# Patient Record
Sex: Male | Born: 1955
Health system: Southern US, Community
[De-identification: ages and names within clinical notes are randomized; demographics above are authoritative.]

## PROBLEM LIST (undated history)

## (undated) DIAGNOSIS — H9190 Unspecified hearing loss, unspecified ear: Secondary | ICD-10-CM

## (undated) DIAGNOSIS — E7800 Pure hypercholesterolemia, unspecified: Secondary | ICD-10-CM

## (undated) DIAGNOSIS — Z8279 Family history of other congenital malformations, deformations and chromosomal abnormalities: Secondary | ICD-10-CM

## (undated) DIAGNOSIS — J301 Allergic rhinitis due to pollen: Secondary | ICD-10-CM

## (undated) DIAGNOSIS — E559 Vitamin D deficiency, unspecified: Secondary | ICD-10-CM

## (undated) DIAGNOSIS — K4090 Unilateral inguinal hernia, without obstruction or gangrene, not specified as recurrent: Secondary | ICD-10-CM

## (undated) DIAGNOSIS — Z87442 Personal history of urinary calculi: Secondary | ICD-10-CM

## (undated) DIAGNOSIS — R319 Hematuria, unspecified: Secondary | ICD-10-CM

## (undated) DIAGNOSIS — E785 Hyperlipidemia, unspecified: Secondary | ICD-10-CM

## (undated) DIAGNOSIS — J329 Chronic sinusitis, unspecified: Secondary | ICD-10-CM

## (undated) HISTORY — PX: OTHER SURGICAL HISTORY: SHX169

## (undated) HISTORY — DX: Chronic sinusitis, unspecified: J32.9

## (undated) HISTORY — DX: Unspecified hearing loss, unspecified ear: H91.90

## (undated) HISTORY — DX: Family history of other congenital malformations, deformations and chromosomal abnormalities: Z82.79

## (undated) HISTORY — DX: Allergic rhinitis due to pollen: J30.1

## (undated) HISTORY — PX: WISDOM TOOTH EXTRACTION: SHX21

## (undated) HISTORY — DX: Vitamin D deficiency, unspecified: E55.9

## (undated) HISTORY — DX: Hyperlipidemia, unspecified: E78.5

## (undated) HISTORY — DX: Pure hypercholesterolemia, unspecified: E78.00

## (undated) HISTORY — DX: Hematuria, unspecified: R31.9

---

## 2003-07-16 ENCOUNTER — Encounter: Admission: RE | Admit: 2003-07-16 | Discharge: 2003-07-16 | Payer: Self-pay | Admitting: Family Medicine

## 2004-11-09 ENCOUNTER — Encounter: Admission: RE | Admit: 2004-11-09 | Discharge: 2004-11-09 | Payer: Self-pay | Admitting: Family Medicine

## 2004-12-01 ENCOUNTER — Encounter: Admission: RE | Admit: 2004-12-01 | Discharge: 2004-12-01 | Payer: Self-pay | Admitting: Family Medicine

## 2005-09-14 ENCOUNTER — Ambulatory Visit: Payer: Self-pay | Admitting: Family Medicine

## 2005-09-20 ENCOUNTER — Ambulatory Visit: Payer: Self-pay | Admitting: Family Medicine

## 2006-05-25 ENCOUNTER — Ambulatory Visit: Payer: Self-pay | Admitting: Family Medicine

## 2008-01-19 ENCOUNTER — Ambulatory Visit: Payer: Self-pay | Admitting: Family Medicine

## 2008-01-19 LAB — CONVERTED CEMR LAB
ALT: 15 units/L (ref 0–53)
AST: 21 units/L (ref 0–37)
Albumin: 4.2 g/dL (ref 3.5–5.2)
Alkaline Phosphatase: 40 units/L (ref 39–117)
BUN: 16 mg/dL (ref 6–23)
Basophils Absolute: 0 10*3/uL (ref 0.0–0.1)
Basophils Relative: 0.6 % (ref 0.0–3.0)
Bilirubin Urine: NEGATIVE
Bilirubin, Direct: 0.1 mg/dL (ref 0.0–0.3)
Blood in Urine, dipstick: NEGATIVE
CO2: 32 meq/L (ref 19–32)
Calcium: 9.5 mg/dL (ref 8.4–10.5)
Chloride: 103 meq/L (ref 96–112)
Cholesterol: 198 mg/dL (ref 0–200)
Creatinine, Ser: 1.1 mg/dL (ref 0.4–1.5)
Eosinophils Absolute: 0.1 10*3/uL (ref 0.0–0.7)
Eosinophils Relative: 2.4 % (ref 0.0–5.0)
GFR calc Af Amer: 91 mL/min
GFR calc non Af Amer: 75 mL/min
Glucose, Bld: 74 mg/dL (ref 70–99)
Glucose, Urine, Semiquant: NEGATIVE
HCT: 41.4 % (ref 39.0–52.0)
HDL: 60.5 mg/dL (ref >39.0)
Hemoglobin: 14.2 g/dL (ref 13.0–17.0)
Ketones, urine, test strip: NEGATIVE
LDL Cholesterol: 133 mg/dL — ABNORMAL HIGH (ref 0–99)
Lymphocytes Relative: 39.1 % (ref 12.0–46.0)
MCHC: 34.3 g/dL (ref 30.0–36.0)
MCV: 89.9 fL (ref 78.0–100.0)
Monocytes Absolute: 0.4 10*3/uL (ref 0.1–1.0)
Monocytes Relative: 8.3 % (ref 3.0–12.0)
Neutro Abs: 2.7 10*3/uL (ref 1.4–7.7)
Neutrophils Relative %: 49.6 % (ref 43.0–77.0)
Nitrite: NEGATIVE
PSA: 0.35 ng/mL (ref 0.10–4.00)
Platelets: 233 10*3/uL (ref 150–400)
Potassium: 4.2 meq/L (ref 3.5–5.1)
Protein, U semiquant: NEGATIVE
RBC: 4.61 M/uL (ref 4.22–5.81)
RDW: 12.8 % (ref 11.5–14.6)
Sodium: 141 meq/L (ref 135–145)
Specific Gravity, Urine: 1.025
TSH: 0.86 microintl units/mL (ref 0.35–5.50)
Total Bilirubin: 1.1 mg/dL (ref 0.3–1.2)
Total CHOL/HDL Ratio: 3.3
Total Protein: 6.9 g/dL (ref 6.0–8.3)
Triglycerides: 22 mg/dL (ref 0–149)
Urobilinogen, UA: 0.2
VLDL: 4 mg/dL (ref 0–40)
WBC Urine, dipstick: NEGATIVE
WBC: 5.3 10*3/uL (ref 4.5–10.5)
pH: 5.5

## 2008-01-26 ENCOUNTER — Ambulatory Visit: Payer: Self-pay | Admitting: Family Medicine

## 2008-04-03 ENCOUNTER — Ambulatory Visit: Payer: Self-pay | Admitting: Gastroenterology

## 2008-04-29 ENCOUNTER — Ambulatory Visit: Payer: Self-pay | Admitting: Gastroenterology

## 2008-04-29 HISTORY — PX: COLONOSCOPY: SHX174

## 2008-04-29 LAB — HM COLONOSCOPY

## 2009-01-28 ENCOUNTER — Ambulatory Visit (HOSPITAL_BASED_OUTPATIENT_CLINIC_OR_DEPARTMENT_OTHER): Admission: RE | Admit: 2009-01-28 | Discharge: 2009-01-28 | Payer: Self-pay | Admitting: Urology

## 2010-03-12 ENCOUNTER — Ambulatory Visit: Payer: Self-pay | Admitting: Family Medicine

## 2010-03-12 LAB — CONVERTED CEMR LAB
ALT: 21 units/L (ref 0–53)
AST: 23 units/L (ref 0–37)
Albumin: 4.3 g/dL (ref 3.5–5.2)
Alkaline Phosphatase: 44 units/L (ref 39–117)
BUN: 16 mg/dL (ref 6–23)
Basophils Absolute: 0 10*3/uL (ref 0.0–0.1)
Basophils Relative: 0.6 % (ref 0.0–3.0)
Bilirubin Urine: NEGATIVE
Bilirubin, Direct: 0.1 mg/dL (ref 0.0–0.3)
Blood in Urine, dipstick: NEGATIVE
CO2: 31 meq/L (ref 19–32)
Calcium: 9.3 mg/dL (ref 8.4–10.5)
Chloride: 100 meq/L (ref 96–112)
Cholesterol: 227 mg/dL — ABNORMAL HIGH (ref 0–200)
Creatinine, Ser: 1 mg/dL (ref 0.4–1.5)
Direct LDL: 138.8 mg/dL
Eosinophils Absolute: 0.1 10*3/uL (ref 0.0–0.7)
Eosinophils Relative: 2.3 % (ref 0.0–5.0)
GFR calc non Af Amer: 80 mL/min (ref >60)
Glucose, Bld: 89 mg/dL (ref 70–99)
Glucose, Urine, Semiquant: NEGATIVE
HCT: 39.3 % (ref 39.0–52.0)
HDL: 66.1 mg/dL (ref >39.00)
Hemoglobin: 13.3 g/dL (ref 13.0–17.0)
Ketones, urine, test strip: NEGATIVE
Lymphocytes Relative: 35.2 % (ref 12.0–46.0)
Lymphs Abs: 1.8 10*3/uL (ref 0.7–4.0)
MCHC: 33.9 g/dL (ref 30.0–36.0)
MCV: 90.4 fL (ref 78.0–100.0)
Monocytes Absolute: 0.5 10*3/uL (ref 0.1–1.0)
Monocytes Relative: 10.4 % (ref 3.0–12.0)
Neutro Abs: 2.7 10*3/uL (ref 1.4–7.7)
Neutrophils Relative %: 51.5 % (ref 43.0–77.0)
Nitrite: NEGATIVE
PSA: 0.41 ng/mL (ref 0.10–4.00)
Platelets: 221 10*3/uL (ref 150.0–400.0)
Potassium: 4 meq/L (ref 3.5–5.1)
Protein, U semiquant: NEGATIVE
RBC: 4.35 M/uL (ref 4.22–5.81)
RDW: 14 % (ref 11.5–14.6)
Sodium: 138 meq/L (ref 135–145)
Specific Gravity, Urine: 1.02
TSH: 0.86 microintl units/mL (ref 0.35–5.50)
Total Bilirubin: 0.7 mg/dL (ref 0.3–1.2)
Total CHOL/HDL Ratio: 3
Total Protein: 6.8 g/dL (ref 6.0–8.3)
Triglycerides: 29 mg/dL (ref 0.0–149.0)
Urobilinogen, UA: 0.2
VLDL: 5.8 mg/dL (ref 0.0–40.0)
WBC Urine, dipstick: NEGATIVE
WBC: 5.2 10*3/uL (ref 4.5–10.5)
pH: 7

## 2010-03-19 ENCOUNTER — Ambulatory Visit: Payer: Self-pay | Admitting: Family Medicine

## 2010-03-19 ENCOUNTER — Encounter: Payer: Self-pay | Admitting: Family Medicine

## 2010-05-12 NOTE — Assessment & Plan Note (Signed)
Summary: cpx//ccm   Vital Signs:  Patient profile:   55 year old male Height:      72.75 inches Weight:      190 pounds BMI:     25.33 Temp:     98.4 degrees F oral BP sitting:   98 / 68  (left arm) Cuff size:   regular  Vitals Entered By: Kern Reap CMA Duncan Dull) (March 19, 2010 2:47 PM) CC: cpx Is Patient Diabetic? No   CC:  cpx.  History of Present Illness: Scott Schultz is a 55 year old,  married male, nonsmoker physically fit, who comes in today for general physical examination  He's always been in excellent health.  He said no chronic health problems.  He gets routine eye care, dental care, colonoscopy last year.  Normal, tetanus, 2004, seasonal flu shot 2011.  Review of systems negative except for some shoulder pain.  He had two to 3 weeks ago.  It went away spontaneo  Not related to exertion.  No shortness of breath, etc.  Allergies: No Known Drug Allergies  Past History:  Past medical, surgical, family and social histories (including risk factors) reviewed, and no changes noted (except as noted below).  Past Medical History: Reviewed history from 01/10/2007 and no changes required. Hearing Loss Sinusitis  Past Surgical History: Reviewed history from 01/10/2007 and no changes required. Denies surgical history  Family History: Reviewed history from 01/26/2008 and no changes required. father and mother both in good health in their 79s.  Four brothers in good health.  No sisters  Social History: Reviewed history from 01/26/2008 and no changes required. Occupation: Married Never Smoked Alcohol use-yes Regular exercise-yes  Review of Systems      See HPI  Physical Exam  General:  Well-developed,well-nourished,in no acute distress; alert,appropriate and cooperative throughout examination Head:  Normocephalic and atraumatic without obvious abnormalities. No apparent alopecia or balding. Eyes:  No corneal or conjunctival inflammation noted. EOMI. Perrla.  Funduscopic exam benign, without hemorrhages, exudates or papilledema. Vision grossly normal. Ears:  External ear exam shows no significant lesions or deformities.  Otoscopic examination reveals clear canals, tympanic membranes are intact bilaterally without bulging, retraction, inflammation or discharge. Hearing is grossly normal bilaterally. Nose:  External nasal examination shows no deformity or inflammation. Nasal mucosa are pink and moist without lesions or exudates. Mouth:  Oral mucosa and oropharynx without lesions or exudates.  Teeth in good repair. Neck:  No deformities, masses, or tenderness noted. Chest Wall:  No deformities, masses, tenderness or gynecomastia noted. Breasts:  No masses or gynecomastia noted Lungs:  Normal respiratory effort, chest expands symmetrically. Lungs are clear to auscultation, no crackles or wheezes. Heart:  Normal rate and regular rhythm. S1 and S2 normal without gallop, murmur, click, rub or other extra sounds. Abdomen:  Bowel sounds positive,abdomen soft and non-tender without masses, organomegaly or hernias noted. Rectal:  No external abnormalities noted. Normal sphincter tone. No rectal masses or tenderness. Genitalia:  Testes bilaterally descended without nodularity, tenderness or masses. No scrotal masses or lesions. No penis lesions or urethral discharge. Prostate:  Prostate gland firm and smooth, no enlargement, nodularity, tenderness, mass, asymmetry or induration. Msk:  No deformity or scoliosis noted of thoracic or lumbar spine.   Pulses:  R and L carotid,radial,femoral,dorsalis pedis and posterior tibial pulses are full and equal bilaterally Extremities:  No clubbing, cyanosis, edema, or deformity noted with normal full range of motion of all joints.   Neurologic:  No cranial nerve deficits noted. Station and gait are normal.  Plantar reflexes are down-going bilaterally. DTRs are symmetrical throughout. Sensory, motor and coordinative functions appear  intact. Skin:  Intact without suspicious lesions or rashes Cervical Nodes:  No lymphadenopathy noted Axillary Nodes:  No palpable lymphadenopathy Inguinal Nodes:  No significant adenopathy Psych:  Cognition and judgment appear intact. Alert and cooperative with normal attention span and concentration. No apparent delusions, illusions, hallucinations   Impression & Recommendations:  Problem # 1:  Preventive Health Care (ICD-V70.0) Assessment Unchanged  Other Orders: EKG w/ Interpretation (93000)  Patient Instructions: 1)  Please schedule a follow-up appointment in 1 year. 2)  It is important that you exercise regularly at least 20 minutes 5 times a week. If you develop chest pain, have severe difficulty breathing, or feel very tired , stop exercising immediately and seek medical attention. 3)  Take an Aspirin every day.   Orders Added: 1)  Est. Patient 40-64 years [99396] 2)  EKG w/ Interpretation [93000]   Immunization History:  Influenza Immunization History:    Influenza:  historical (01/10/2010)  Tetanus/Td Immunization History:    Tetanus/Td:  historical (04/12/2002)   Immunization History:  Influenza Immunization History:    Influenza:  Historical (01/10/2010)  Tetanus/Td Immunization History:    Tetanus/Td:  Historical (04/12/2002)

## 2010-07-16 LAB — POCT HEMOGLOBIN-HEMACUE: Hemoglobin: 14.3 g/dL (ref 13.0–17.0)

## 2010-10-13 ENCOUNTER — Encounter: Payer: Self-pay | Admitting: Family Medicine

## 2010-10-15 ENCOUNTER — Encounter: Payer: Self-pay | Admitting: Family Medicine

## 2010-10-15 ENCOUNTER — Ambulatory Visit (INDEPENDENT_AMBULATORY_CARE_PROVIDER_SITE_OTHER)
Admission: RE | Admit: 2010-10-15 | Discharge: 2010-10-15 | Disposition: A | Discharge status: Home / Self Care | Payer: 59 | Source: Ambulatory Visit | Attending: Family Medicine | Admitting: Family Medicine

## 2010-10-15 ENCOUNTER — Ambulatory Visit (INDEPENDENT_AMBULATORY_CARE_PROVIDER_SITE_OTHER): Payer: 59 | Admitting: Family Medicine

## 2010-10-15 VITALS — BP 110/80 | Temp 97.7°F | Ht 75.0 in | Wt 185.0 lb

## 2010-10-15 DIAGNOSIS — R51 Headache: Secondary | ICD-10-CM

## 2010-10-15 MED ORDER — PREDNISONE 20 MG PO TABS: ORAL_TABLET | ORAL | Status: DC

## 2010-10-15 MED ORDER — AMOXICILLIN 500 MG PO CAPS: ORAL_CAPSULE | ORAL | Status: DC

## 2010-10-15 NOTE — Progress Notes (Signed)
  Subjective:    Patient ID: Scott Schultz, male    DOB: 03-15-1956, 55 y.o.   MRN: [id number]  HPI Scott Schultz is a 55 year old, married male, nonsmoker, who comes in today with a 76-month history of right frontal and maxillary sinus congestion.  He states his symptoms, come and go.  He has no fever.  No history of trauma.  He does have a history of underlying allergic rhinitis.  Left side clear.   Review of Systems General an immunologic review of systems otherwise negative    Objective:   Physical Exam    Well-developed well-nourished, male in no acute distress.  HEENT negative.  Neck was supple.  No adenopathy.  Examination listed as a septum in the midline.  He has almost a total occlusion on the right.  Turbinates, left is fairly clear    Assessment & Plan:  Allergic rhinitis.  Plan x-ray sinuses to rule out sinus infection, prednisone burst and taper in pediatrics as needed.  ENT consult if not improved

## 2010-10-15 NOTE — Patient Instructions (Signed)
Go to the main office now for x-rays of your sinuses.  Scott Schultz will call you tomorrow with the report.  If there is infection.  We will start her on amoxicillin, 500 mg 3 times a day for 3 weeks if no infection, then we used prednisone as directed

## 2010-12-07 ENCOUNTER — Ambulatory Visit (INDEPENDENT_AMBULATORY_CARE_PROVIDER_SITE_OTHER): Payer: 59 | Admitting: Family Medicine

## 2010-12-07 ENCOUNTER — Encounter: Payer: Self-pay | Admitting: Family Medicine

## 2010-12-07 VITALS — BP 100/62 | Temp 98.1°F | Wt 183.0 lb

## 2010-12-07 DIAGNOSIS — L247 Irritant contact dermatitis due to plants, except food: Secondary | ICD-10-CM

## 2010-12-07 DIAGNOSIS — L255 Unspecified contact dermatitis due to plants, except food: Secondary | ICD-10-CM

## 2010-12-07 NOTE — Progress Notes (Signed)
  Subjective:    Patient ID: Scott Schultz, male    DOB: Dec 12, 1955, 55 y.o.   MRN: [id number]  HPIJohn is a 55 year old male, who comes in today with a rash for 3 days.  He first noticed a streaky red pruritic lesion on his left lower extremity and into it spread to his trunk.  Review of systems otherwise negative    Review of Systems    [facility] dermatologic review of systems otherwise negative Objective:   Physical Exam Well-developed well-nourished man in no acute distress.  Examination.  Skin shows red.  The elevated lesions consistent with a contact dermatitis       Assessment & Plan:  Contact dermatitis.  Plan prednisone burst and taper return p.r.n.

## 2010-12-07 NOTE — Patient Instructions (Signed)
3  prednisone tablets for 3 days, one for 3 days, a half for 3 days, then stop

## 2010-12-30 ENCOUNTER — Telehealth: Payer: Self-pay | Admitting: *Deleted

## 2010-12-30 NOTE — Telephone Encounter (Signed)
Pt is having bilateral leg numbness???  Appt made with Dr. Tawanna Cooler tomorrow.

## 2010-12-31 ENCOUNTER — Ambulatory Visit (INDEPENDENT_AMBULATORY_CARE_PROVIDER_SITE_OTHER): Payer: 59 | Admitting: Family Medicine

## 2010-12-31 ENCOUNTER — Encounter: Payer: Self-pay | Admitting: Family Medicine

## 2010-12-31 VITALS — BP 110/68 | Temp 98.6°F | Wt 183.0 lb

## 2010-12-31 DIAGNOSIS — R202 Paresthesia of skin: Secondary | ICD-10-CM

## 2010-12-31 DIAGNOSIS — R209 Unspecified disturbances of skin sensation: Secondary | ICD-10-CM

## 2010-12-31 DIAGNOSIS — R200 Anesthesia of skin: Secondary | ICD-10-CM | POA: Insufficient documentation

## 2010-12-31 NOTE — Patient Instructions (Signed)
Take 600 mg of Motrin twice daily with food.  If in the next couple weeks.  Her symptoms do not abate or if develops severe pain, then call

## 2010-12-31 NOTE — Progress Notes (Signed)
  Subjective:    Patient ID: Scott Schultz, male    DOB: Feb 12, 1956, 55 y.o.   MRN: [id number]  Scott Schultz is a delightful, 55 year old, married man nonsmoker,,,,,,,, his wife, Scott Schultz, works for his [employer],,,,,,,,,, who comes in today for evaluation of numbness and tingling in both lower extremities for two days.  He states, that he's had this tingling sensation that comes and goes.  It involves both legs, and the hips to the feet.  It will last for a few seconds maybe a minute or two and then go away.  No history of trauma No pain.  Years ago.  He had a lumbar disk problem.  He was seen by an orthopedist and had a workup.  That resolved without any surgical intervention.    Review of Systems General and neurologic review of systems otherwise negative.  No bowel.  No bladder dysfunction    Objective:   Physical Exam  Well-developed well-nourished man in acute distress.  Examination and was normal.  Lower extremities appear normal.  Muscle strength, sensation, reflexes, circulation, all normal.  Straight leg raising negative      Assessment & Plan:  Symptoms consistent with a central bulging disk.  Plan Motrin 600 b.i.d. Observe.  If symptoms do not resolve or he develops severe pain, then will pursue evaluation

## 2011-01-22 ENCOUNTER — Ambulatory Visit (INDEPENDENT_AMBULATORY_CARE_PROVIDER_SITE_OTHER): Payer: 59

## 2011-01-22 DIAGNOSIS — Z23 Encounter for immunization: Secondary | ICD-10-CM

## 2011-01-28 ENCOUNTER — Encounter: Payer: Self-pay | Admitting: Family Medicine

## 2011-01-28 ENCOUNTER — Telehealth: Payer: Self-pay | Admitting: Family Medicine

## 2011-01-28 ENCOUNTER — Ambulatory Visit (INDEPENDENT_AMBULATORY_CARE_PROVIDER_SITE_OTHER): Payer: 59 | Admitting: Family Medicine

## 2011-01-28 DIAGNOSIS — T7840XA Allergy, unspecified, initial encounter: Secondary | ICD-10-CM

## 2011-01-28 DIAGNOSIS — R51 Headache: Secondary | ICD-10-CM

## 2011-01-28 HISTORY — DX: Allergy, unspecified, initial encounter: T78.40XA

## 2011-01-28 MED ORDER — PREDNISONE 20 MG PO TABS: ORAL_TABLET | ORAL | Status: DC

## 2011-01-28 NOTE — Patient Instructions (Signed)
Take the prednisone as directed.  You can use small amounts of the OTC steroid cream and Lubriderm.  Claritin 10 mg in the morning and Benadryl 25 mg at bedtime to help with the itching.  Stop the Augmentin.  Return p.r.n.

## 2011-01-28 NOTE — Telephone Encounter (Signed)
Okay to work in patient is aware

## 2011-01-28 NOTE — Telephone Encounter (Signed)
Pt has a sinus infection and it is going up into his eyes, his are are very swollen and he would like to come in today. Work in?

## 2011-01-28 NOTE — Progress Notes (Signed)
  Subjective:    Patient ID: Scott Schultz, male    DOB: 1956/01/26, 55 y.o.   MRN: [id number]  Scott Schultz is a delightful, 55 year old, married male, nonsmoker, who comes in today for evaluation of a skin rash.  We saw him a couple weeks ago with a flare of his allergic rhinitis.  He had tried the over-the-counter medications to no avail.  His symptoms at that time or at congestion, postnasal drip, cough, with no wheezing.  We gave him a short course of prednisone, which she finished last Thursday.  He felt much better.  On Saturday he noticed some redness and swelling of his eyes.  By Tuesday evening.  They were really red, swollen, and itchy.  [facility] and was told he had a sinus infection and given Augmentin!!!!!!!!!!!!!!!!!!!!!!!!!!!!!!!!.  He stated a because the medications has not helped.  His symptoms.  He's been using over-the-counter.  Mild steroid cream for the allergic reaction of his eyelids.  However, that is not resolved.  The redness and swelling.    Review of Systems    General allergic ENT review of systems, and dermatologic review of systems otherwise negative Objective:   Physical Exam  Well about the marriage now.  No acute distress.  Examination of the HEENT were negative, except the upper and lower lids were read and swollen consistent with a contact dermatitis or      Assessment & Plan:  Bilateral upper and lower eyelid, contact dermatitis,,,,, unresponsive to over-the-counter steroid cream,,,,,,,, prednisone burst and taper.  Allergic rhinitis.......... Prednisone burst and taper......... Stop the Augmentin

## 2011-02-09 ENCOUNTER — Telehealth: Payer: Self-pay | Admitting: *Deleted

## 2011-02-09 NOTE — Telephone Encounter (Signed)
Rachel please call.......... We are reluctant to continue the higher dose of steroid because of the potential long-term side effects.  At this juncture I recommend he see [facility] dermatologist for consultation to see if there is other options.  He can call and make his own appointment

## 2011-02-09 NOTE — Telephone Encounter (Signed)
Spoke with patient.

## 2011-02-09 NOTE — Telephone Encounter (Signed)
Pt's dermatitis around eyes got better while on the higher dosage of Prednisone, but since tapering to 1 q o day, the symptoms are coming back.

## 2011-11-23 ENCOUNTER — Telehealth: Payer: Self-pay | Admitting: Family Medicine

## 2011-11-23 NOTE — Telephone Encounter (Signed)
Caller: [name]/Patient; Patient Name: Scott Schultz; PCP: Roderick Pee.; Best Callback Phone Number: [phone] Injury to Leg - 3 inch cut from Metal Handrail on 11/12/11- Went to [facility]. Given [employer] and wound stitched.  Stitches removed from leg on 11/21/11 and  Started on antibiotic-Keflex on 11/22/11. Today-11/23/11 has pus in center of wound, slight ozzing , and edges look red. He has been putting Neosporin on it several times daily. Triage and Care Advice  per Post Operative Wound Care Protocol and advised to have it checked within 24 hours. He is going to follow care advice and will call back on 11/24/11 if not looking better.

## 2011-11-24 ENCOUNTER — Telehealth: Payer: Self-pay | Admitting: Family Medicine

## 2011-11-24 NOTE — Telephone Encounter (Signed)
Pt has an infected 3" cut on rt leg. Pt said that he spoke to triage nurse yesterday, who recommended pt to come in for ov asap. Pt req call back from nurse. Pls see previouse triage note.

## 2011-11-24 NOTE — Telephone Encounter (Signed)
Patient coming to the office in the morning

## 2011-11-25 ENCOUNTER — Ambulatory Visit: Payer: 59 | Admitting: Family Medicine

## 2012-01-25 ENCOUNTER — Ambulatory Visit (INDEPENDENT_AMBULATORY_CARE_PROVIDER_SITE_OTHER): Payer: 59 | Admitting: Family Medicine

## 2012-01-25 DIAGNOSIS — Z23 Encounter for immunization: Secondary | ICD-10-CM

## 2012-03-24 ENCOUNTER — Other Ambulatory Visit (INDEPENDENT_AMBULATORY_CARE_PROVIDER_SITE_OTHER): Payer: 59

## 2012-03-24 DIAGNOSIS — Z0000 Encounter for general adult medical examination without abnormal findings: Secondary | ICD-10-CM

## 2012-03-24 LAB — TSH: TSH: 1.12 u[IU]/mL (ref 0.35–5.50)

## 2012-03-24 LAB — HEPATIC FUNCTION PANEL
ALT: 17 U/L (ref 0–53)
AST: 21 U/L (ref 0–37)
Albumin: 4.3 g/dL (ref 3.5–5.2)
Alkaline Phosphatase: 46 U/L (ref 39–117)
Bilirubin, Direct: 0.1 mg/dL (ref 0.0–0.3)
Total Bilirubin: 0.7 mg/dL (ref 0.3–1.2)
Total Protein: 6.8 g/dL (ref 6.0–8.3)

## 2012-03-24 LAB — CBC WITH DIFFERENTIAL/PLATELET
Basophils Absolute: 0 10*3/uL (ref 0.0–0.1)
Basophils Relative: 0.5 % (ref 0.0–3.0)
Eosinophils Absolute: 0.3 10*3/uL (ref 0.0–0.7)
Eosinophils Relative: 5.1 % — ABNORMAL HIGH (ref 0.0–5.0)
HCT: 42.9 % (ref 39.0–52.0)
Hemoglobin: 14.4 g/dL (ref 13.0–17.0)
Lymphocytes Relative: 33.8 % (ref 12.0–46.0)
Lymphs Abs: 1.8 10*3/uL (ref 0.7–4.0)
MCHC: 33.6 g/dL (ref 30.0–36.0)
MCV: 89.9 fl (ref 78.0–100.0)
Monocytes Absolute: 0.5 10*3/uL (ref 0.1–1.0)
Monocytes Relative: 9.5 % (ref 3.0–12.0)
Neutro Abs: 2.7 10*3/uL (ref 1.4–7.7)
Neutrophils Relative %: 51.1 % (ref 43.0–77.0)
Platelets: 215 10*3/uL (ref 150.0–400.0)
RBC: 4.77 Mil/uL (ref 4.22–5.81)
RDW: 14 % (ref 11.5–14.6)
WBC: 5.3 10*3/uL (ref 4.5–10.5)

## 2012-03-24 LAB — POCT URINALYSIS DIPSTICK
Bilirubin, UA: NEGATIVE
Blood, UA: NEGATIVE
Glucose, UA: NEGATIVE
Ketones, UA: NEGATIVE
Leukocytes, UA: NEGATIVE
Nitrite, UA: NEGATIVE
Protein, UA: NEGATIVE
Spec Grav, UA: 1.02
Urobilinogen, UA: 0.2
pH, UA: 6.5

## 2012-03-24 LAB — BASIC METABOLIC PANEL
BUN: 17 mg/dL (ref 6–23)
CO2: 33 mEq/L — ABNORMAL HIGH (ref 19–32)
Calcium: 9.5 mg/dL (ref 8.4–10.5)
Chloride: 101 mEq/L (ref 96–112)
Creatinine, Ser: 0.9 mg/dL (ref 0.4–1.5)
GFR: 91.6 mL/min (ref >60.00)
Glucose, Bld: 98 mg/dL (ref 70–99)
Potassium: 4.3 mEq/L (ref 3.5–5.1)
Sodium: 139 mEq/L (ref 135–145)

## 2012-03-24 LAB — LIPID PANEL
Cholesterol: 206 mg/dL — ABNORMAL HIGH (ref 0–200)
HDL: 71.3 mg/dL (ref >39.00)
Total CHOL/HDL Ratio: 3
Triglycerides: 35 mg/dL (ref 0.0–149.0)
VLDL: 7 mg/dL (ref 0.0–40.0)

## 2012-03-24 LAB — PSA: PSA: 0.34 ng/mL (ref 0.10–4.00)

## 2012-03-24 LAB — LDL CHOLESTEROL, DIRECT: Direct LDL: 129.8 mg/dL

## 2012-03-30 ENCOUNTER — Encounter: Payer: Self-pay | Admitting: Family Medicine

## 2012-03-30 ENCOUNTER — Ambulatory Visit (INDEPENDENT_AMBULATORY_CARE_PROVIDER_SITE_OTHER): Payer: 59 | Admitting: Family Medicine

## 2012-03-30 VITALS — BP 110/70 | Ht 73.75 in | Wt 189.0 lb

## 2012-03-30 DIAGNOSIS — J309 Allergic rhinitis, unspecified: Secondary | ICD-10-CM

## 2012-03-30 DIAGNOSIS — H9190 Unspecified hearing loss, unspecified ear: Secondary | ICD-10-CM

## 2012-03-30 DIAGNOSIS — Z0000 Encounter for general adult medical examination without abnormal findings: Secondary | ICD-10-CM

## 2012-03-30 MED ORDER — FLUTICASONE PROPIONATE 50 MCG/ACT NA SUSP: 2.0000 | Freq: Every day | NASAL | Status: DC

## 2012-03-30 NOTE — Patient Instructions (Signed)
Continue your good health habits,,,,,,,,,, especially swimming  Zyrtec 10 mg plain one tablet at bedtime  Steroid nasal spray when necessary  Return in one year sooner if any problems  I would also recommend you take an aspirin tablet daily

## 2012-03-30 NOTE — Progress Notes (Signed)
  Subjective:    Patient ID: Scott Schultz, male    DOB: October 22, 1955, 56 y.o.   MRN: [id number]  HPI Caio is a 56 year old married male nonsmoker who comes in today for general physical examination  He's always been in excellent health he has no chronic health problems except for chronic hearing loss and he wears bilateral hearing aids.  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  Review of systems negative except for some underlying allergic rhinitis   Review of Systems  Constitutional: Negative.   HENT: Negative.   Eyes: Negative.   Respiratory: Negative.   Cardiovascular: Negative.   Gastrointestinal: Negative.   Genitourinary: Negative.   Musculoskeletal: Negative.   Skin: Negative.   Neurological: Negative.   Hematological: Negative.   Psychiatric/Behavioral: Negative.        Objective:   Physical Exam  Constitutional: He is oriented to person, place, and time. He appears well-developed and well-nourished.  HENT:  Head: Normocephalic and atraumatic.  Right Ear: External ear normal.  Left Ear: External ear normal.  Nose: Nose normal.  Mouth/Throat: Oropharynx is clear and moist.  Eyes: Conjunctivae normal and EOM are normal. Pupils are equal, round, and reactive to light.  Neck: Normal range of motion. Neck supple. No JVD present. No tracheal deviation present. No thyromegaly present.       Bilateral hearing aids  Cardiovascular: Normal rate, regular rhythm, normal heart sounds and intact distal pulses.  Exam reveals no gallop and no friction rub.   No murmur heard. Pulmonary/Chest: Effort normal and breath sounds normal. No stridor. No respiratory distress. He has no wheezes. He has no rales. He exhibits no tenderness.  Abdominal: Soft. Bowel sounds are normal. He exhibits no distension and no mass. There is no  tenderness. There is no rebound and no guarding.  Genitourinary: Rectum normal, prostate normal and penis normal. Guaiac negative stool. No penile tenderness.  Musculoskeletal: Normal range of motion. He exhibits no edema and no tenderness.  Lymphadenopathy:    He has no cervical adenopathy.  Neurological: He is alert and oriented to person, place, and time. He has normal reflexes. No cranial nerve deficit. He exhibits normal muscle tone.  Skin: Skin is warm and dry. No rash noted. No erythema. No pallor.  Psychiatric: He has a normal mood and affect. His behavior is normal. Judgment and thought content normal.          Assessment & Plan:  Healthy male  Hearing loss bilateral hearing aids  Allergic rhinitis OT  Antihistamine each bedtime

## 2012-04-06 ENCOUNTER — Encounter: Payer: Self-pay | Admitting: Family Medicine

## 2012-04-06 ENCOUNTER — Ambulatory Visit (INDEPENDENT_AMBULATORY_CARE_PROVIDER_SITE_OTHER): Payer: 59 | Admitting: Family Medicine

## 2012-04-06 VITALS — BP 110/68 | Temp 98.1°F | Wt 190.0 lb

## 2012-04-06 DIAGNOSIS — L255 Unspecified contact dermatitis due to plants, except food: Secondary | ICD-10-CM

## 2012-04-06 MED ORDER — PREDNISONE 20 MG PO TABS: ORAL_TABLET | ORAL | Status: DC

## 2012-04-06 NOTE — Patient Instructions (Addendum)
Poison Ivy  Poison ivy is a inflammation of the skin (contact dermatitis) caused by touching the allergens on the leaves of the ivy plant following previous exposure to the plant. The rash usually appears 48 hours after exposure. The rash is usually bumps (papules) or blisters (vesicles) in a linear pattern. Depending on your own sensitivity, the rash may simply cause redness and itching, or it may also progress to blisters which may break open. These must be well cared for to prevent secondary bacterial (germ) infection, followed by scarring. Keep any open areas dry, clean, dressed, and covered with an antibacterial ointment if needed. The eyes may also get puffy. The puffiness is worst in the morning and gets better as the day progresses. This dermatitis usually heals without scarring, within 2 to 3 weeks without treatment.  HOME CARE INSTRUCTIONS   Thoroughly wash with soap and water as soon as you have been exposed to poison ivy. You have about one half hour to remove the plant resin before it will cause the rash. This washing will destroy the oil or antigen on the skin that is causing, or will cause, the rash. Be sure to wash under your fingernails as any plant resin there will continue to spread the rash. Do not rub skin vigorously when washing affected area. Poison ivy cannot spread if no oil from the plant remains on your body. A rash that has progressed to weeping sores will not spread the rash unless you have not washed thoroughly. It is also important to wash any clothes you have been wearing as these may carry active allergens. The rash will return if you wear the unwashed clothing, even several days later.  Avoidance of the plant in the future is the best measure. Poison ivy plant can be recognized by the number of leaves. Generally, poison ivy has three leaves with flowering branches on a single stem.  Diphenhydramine may be purchased over the counter and used as needed for itching. Do not drive with  this medication if it makes you drowsy.Ask your caregiver about medication for children.  SEEK MEDICAL CARE IF:   Open sores develop.   Redness spreads beyond area of rash.   You notice purulent (pus-like) discharge.   You have increased pain.   Other signs of infection develop (such as fever).  Document Released: 03/26/2000 Document Revised: 06/21/2011 Document Reviewed: 02/12/2009  [employer] Patient Information 2013 [employer].

## 2012-04-06 NOTE — Progress Notes (Signed)
Chief Complaint  Patient presents with  . Rash    HPI:  Acute visit for skin rash: -last week was cleaning out brush in yard -now with rash on hands, arms, chest and face - itchy -has had poison ivy before and this is just like it -has been using benadryl spray -no swelling of lips,eyes, mouth, no trouble breathing   ROS: See pertinent positives and negatives per HPI.  Past Medical History  Diagnosis Date  . Hearing loss   . Sinusitis     Family History  Problem Relation Age of Onset  . Healthy Mother   . Healthy Father   . Healthy Brother   . Healthy Brother   . Healthy Brother   . Healthy Brother     History   Social History  . Marital Status: Married    Spouse Name: N/A    Number of Children: N/A  . Years of Education: N/A   Social History Main Topics  . Smoking status: Never Smoker   . Smokeless tobacco: Never Used  . Alcohol Use: Yes  . Drug Use: No  . Sexually Active: None   Other Topics Concern  . None   Social History Narrative   Occupation:MarriedNever SmokedAlcohol use- yesRegular exercise- yes    Current outpatient prescriptions:fluticasone (FLONASE) 50 MCG/ACT nasal spray, Place 2 sprays into the nose daily., Disp: 16 g, Rfl: 6;  predniSONE (DELTASONE) 20 MG tablet, 60mg  (3 tab) QD x4 days, then 40mg  (2 tabs) QD x4 days, then 20mg  (1 tab) QD for 4 days, then 10mg  (1/2 tab) daily for 4 days, Disp: 26 tablet, Rfl: 0  EXAM:  Filed Vitals:   04/06/12 0815  BP: 110/68  Temp: 98.1 F (36.7 C)    There is no height on file to calculate BMI.  GENERAL: vitals reviewed and listed above, alert, oriented, appears well hydrated and in no acute distress  HEENT: atraumatic, conjunttiva clear, no obvious abnormalities on inspection of external nose and ears  NECK: no obvious masses on inspection  LUNGS: clear to auscultation bilaterally, no wheezes, rales or rhonchi, good air movement  CV: HRRR, no peripheral edema  SKIN: vasicular papular  erythematous rash on arms, hands, chest, neck and small patch on L cheek  MS: moves all extremities without noticeable abnormality  PSYCH: pleasant and cooperative, no obvious depression or anxiety  ASSESSMENT AND PLAN:  Discussed the following assessment and plan:  1. Toxicodendron dermatitis  predniSONE (DELTASONE) 20 MG tablet   -discussed risks of systemic steroids and return precautions -Patient advised to return or notify a doctor immediately if symptoms worsen or persist or new concerns arise.  Patient Instructions  Poison [facility] ivy is a inflammation of the skin (contact dermatitis) caused by touching the allergens on the leaves of the ivy plant following previous exposure to the plant. The rash usually appears 48 hours after exposure. The rash is usually bumps (papules) or blisters (vesicles) in a linear pattern. Depending on your own sensitivity, the rash may simply cause redness and itching, or it may also progress to blisters which may break open. These must be well cared for to prevent secondary bacterial (germ) infection, followed by scarring. Keep any open areas dry, clean, dressed, and covered with an antibacterial ointment if needed. The eyes may also get puffy. The puffiness is worst in the morning and gets better as the day progresses. This dermatitis usually heals without scarring, within 2 to 3 weeks without treatment. HOME CARE INSTRUCTIONS  Thoroughly  wash with soap and water as soon as you have been exposed to poison ivy. You have about one half hour to remove the plant resin before it will cause the rash. This washing will destroy the oil or antigen on the skin that is causing, or will cause, the rash. Be sure to wash under your fingernails as any plant resin there will continue to spread the rash. Do not rub skin vigorously when washing affected area. Poison ivy cannot spread if no oil from the plant remains on your body. A rash that has progressed to weeping sores  will not spread the rash unless you have not washed thoroughly. It is also important to wash any clothes you have been wearing as these may carry active allergens. The rash will return if you wear the unwashed clothing, even several days later. Avoidance of the plant in the future is the best measure. Poison ivy plant can be recognized by the number of leaves. Generally, poison ivy has three leaves with flowering branches on a single stem. Diphenhydramine may be purchased over the counter and used as needed for itching. Do not drive with this medication if it makes you drowsy.Ask your caregiver about medication for children. SEEK MEDICAL CARE IF:  Open sores develop.  Redness spreads beyond area of rash.  You notice purulent (pus-like) discharge.  You have increased pain.  Other signs of infection develop (such as fever). Document Released: 03/26/2000 Document Revised: 06/21/2011 Document Reviewed: 02/12/2009 [facility] Patient Information 2013 [place], [place], [place].

## 2012-08-01 ENCOUNTER — Ambulatory Visit (INDEPENDENT_AMBULATORY_CARE_PROVIDER_SITE_OTHER): Payer: 59 | Admitting: Family Medicine

## 2012-08-01 ENCOUNTER — Encounter: Payer: Self-pay | Admitting: Family Medicine

## 2012-08-01 VITALS — BP 92/62 | Temp 98.3°F | Wt 190.0 lb

## 2012-08-01 DIAGNOSIS — L255 Unspecified contact dermatitis due to plants, except food: Secondary | ICD-10-CM

## 2012-08-01 HISTORY — DX: Unspecified contact dermatitis due to plants, except food: L25.5

## 2012-08-01 MED ORDER — PREDNISONE 20 MG PO TABS: ORAL_TABLET | ORAL | Status: DC

## 2012-08-01 NOTE — Patient Instructions (Signed)
Take the prednisone as directed return when necessary 

## 2012-08-01 NOTE — Progress Notes (Signed)
  Subjective:    Patient ID: Scott Schultz, male    DOB: [birth year], 57 y.o.   MRN: [id number]  HPI Scott Schultz is a 57 year old male who comes in today with a contact dermatitis   Review of Systems    review of systems negative Objective:   Physical Exam  Well-developed well-nourished man no acute distress contact dermatitis affecting his right wrist and the majority of his left neck airway normal      Assessment & Plan:  Contact dermatitis plan prednisone burst and taper

## 2012-08-10 ENCOUNTER — Ambulatory Visit (INDEPENDENT_AMBULATORY_CARE_PROVIDER_SITE_OTHER): Payer: 59 | Admitting: Family Medicine

## 2012-08-10 ENCOUNTER — Encounter: Payer: Self-pay | Admitting: Family Medicine

## 2012-08-10 VITALS — BP 110/70 | Temp 98.2°F | Wt 190.0 lb

## 2012-08-10 DIAGNOSIS — J45909 Unspecified asthma, uncomplicated: Secondary | ICD-10-CM

## 2012-08-10 DIAGNOSIS — J309 Allergic rhinitis, unspecified: Secondary | ICD-10-CM

## 2012-08-10 DIAGNOSIS — L255 Unspecified contact dermatitis due to plants, except food: Secondary | ICD-10-CM

## 2012-08-10 HISTORY — DX: Allergic rhinitis, unspecified: J30.9

## 2012-08-10 HISTORY — DX: Unspecified asthma, uncomplicated: J45.909

## 2012-08-10 MED ORDER — PREDNISONE 20 MG PO TABS: ORAL_TABLET | ORAL | Status: DC

## 2012-08-10 MED ORDER — HYDROCODONE-HOMATROPINE 5-1.5 MG/5ML PO SYRP: ORAL_SOLUTION | ORAL | Status: DC

## 2012-08-10 NOTE — Patient Instructions (Signed)
Prednisone 20 mg.......... 2 tabs x3 days or until you feel a lot better and then begin to taper  Hold the Zyrtec and steroid nasal spray while you're on the oral prednisone  When you finish the oral prednisone then restart the Zyrtec and steroid nasal spray,,,,,,,,,, 1 Zyrtec at bedtime and one shot of a steroid nasal spray up each nostril at bedtime  Hydromet 1/2-1 teaspoon up to 3 times a day when necessary for cough

## 2012-08-10 NOTE — Progress Notes (Signed)
  Subjective:    Patient ID: Scott Schultz, male    DOB: [birth year], 57 y.o.   MRN: [id number]  HPIJohn is a 57 year old male married nonsmoker who comes in today for evaluation of allergic rhinitis  He has a history of perennial allergic rhinitis and on Monday something trigger his allergies. We were not able to identify a specific trigger. His symptoms are head congestion sore throat postnasal drip and cough    Review of Systems    review of systems otherwise negative Objective:   Physical Exam  Well-developed well-nourished male no acute distress HEENT negative except for bilateral hearing aids. Neck was supple no adenopathy lungs are clear except for some faint symmetrical expiratory wheezing on forced expiration      Assessment & Plan:  Allergic rhinitis with reactive airway disease plan prednisone burst and taper

## 2013-02-15 ENCOUNTER — Telehealth: Payer: Self-pay | Admitting: Family Medicine

## 2013-02-15 NOTE — Telephone Encounter (Signed)
Pt needs cpx by end of yr. Pt last cpx 03-2012. Can I create 30 min slot?

## 2013-02-15 NOTE — Telephone Encounter (Signed)
Okay to work in per Dr Todd 

## 2013-02-16 NOTE — Telephone Encounter (Signed)
lmom for pt to call back

## 2013-02-20 NOTE — Telephone Encounter (Signed)
lmom for pt to cb

## 2013-02-22 NOTE — Telephone Encounter (Signed)
lmom for pt to cb

## 2013-02-26 NOTE — Telephone Encounter (Signed)
Pt has been scheduled//kar

## 2013-04-11 ENCOUNTER — Other Ambulatory Visit (INDEPENDENT_AMBULATORY_CARE_PROVIDER_SITE_OTHER): Payer: 59

## 2013-04-11 DIAGNOSIS — Z0000 Encounter for general adult medical examination without abnormal findings: Secondary | ICD-10-CM

## 2013-04-11 LAB — BASIC METABOLIC PANEL
BUN: 17 mg/dL (ref 6–23)
CO2: 31 mEq/L (ref 19–32)
Calcium: 9.2 mg/dL (ref 8.4–10.5)
Chloride: 103 mEq/L (ref 96–112)
Creatinine, Ser: 0.9 mg/dL (ref 0.4–1.5)
GFR: 87.91 mL/min (ref >60.00)
Glucose, Bld: 86 mg/dL (ref 70–99)
Potassium: 4.1 mEq/L (ref 3.5–5.1)
Sodium: 139 mEq/L (ref 135–145)

## 2013-04-11 LAB — POCT URINALYSIS DIPSTICK
Bilirubin, UA: NEGATIVE
Blood, UA: NEGATIVE
Glucose, UA: NEGATIVE
Ketones, UA: NEGATIVE
Leukocytes, UA: NEGATIVE
Nitrite, UA: NEGATIVE
Protein, UA: NEGATIVE
Spec Grav, UA: 1.015
Urobilinogen, UA: 0.2
pH, UA: 6.5

## 2013-04-11 LAB — CBC WITH DIFFERENTIAL/PLATELET
Basophils Absolute: 0 10*3/uL (ref 0.0–0.1)
Basophils Relative: 0.4 % (ref 0.0–3.0)
Eosinophils Absolute: 0.2 10*3/uL (ref 0.0–0.7)
Eosinophils Relative: 3.2 % (ref 0.0–5.0)
HCT: 44.1 % (ref 39.0–52.0)
Hemoglobin: 14.7 g/dL (ref 13.0–17.0)
Lymphocytes Relative: 29.5 % (ref 12.0–46.0)
Lymphs Abs: 2 10*3/uL (ref 0.7–4.0)
MCHC: 33.4 g/dL (ref 30.0–36.0)
MCV: 90.6 fl (ref 78.0–100.0)
Monocytes Absolute: 0.6 10*3/uL (ref 0.1–1.0)
Monocytes Relative: 8.9 % (ref 3.0–12.0)
Neutro Abs: 3.9 10*3/uL (ref 1.4–7.7)
Neutrophils Relative %: 58 % (ref 43.0–77.0)
Platelets: 237 10*3/uL (ref 150.0–400.0)
RBC: 4.87 Mil/uL (ref 4.22–5.81)
RDW: 13.9 % (ref 11.5–14.6)
WBC: 6.8 10*3/uL (ref 4.5–10.5)

## 2013-04-11 LAB — LIPID PANEL
Cholesterol: 243 mg/dL — ABNORMAL HIGH (ref 0–200)
HDL: 69.4 mg/dL (ref >39.00)
Total CHOL/HDL Ratio: 4
Triglycerides: 38 mg/dL (ref 0.0–149.0)
VLDL: 7.6 mg/dL (ref 0.0–40.0)

## 2013-04-11 LAB — HEPATIC FUNCTION PANEL
ALT: 20 U/L (ref 0–53)
AST: 21 U/L (ref 0–37)
Albumin: 4.4 g/dL (ref 3.5–5.2)
Alkaline Phosphatase: 44 U/L (ref 39–117)
Bilirubin, Direct: 0.2 mg/dL (ref 0.0–0.3)
Total Bilirubin: 1.3 mg/dL — ABNORMAL HIGH (ref 0.3–1.2)
Total Protein: 6.7 g/dL (ref 6.0–8.3)

## 2013-04-11 LAB — PSA: PSA: 0.38 ng/mL (ref 0.10–4.00)

## 2013-04-11 LAB — TSH: TSH: 1.25 u[IU]/mL (ref 0.35–5.50)

## 2013-04-11 LAB — LDL CHOLESTEROL, DIRECT: Direct LDL: 160.7 mg/dL

## 2013-04-16 ENCOUNTER — Ambulatory Visit (INDEPENDENT_AMBULATORY_CARE_PROVIDER_SITE_OTHER): Payer: 59 | Admitting: Family Medicine

## 2013-04-16 ENCOUNTER — Encounter: Payer: Self-pay | Admitting: Family Medicine

## 2013-04-16 VITALS — BP 120/78 | Temp 97.7°F | Ht 73.75 in | Wt 188.0 lb

## 2013-04-16 DIAGNOSIS — Z0000 Encounter for general adult medical examination without abnormal findings: Secondary | ICD-10-CM | POA: Insufficient documentation

## 2013-04-16 DIAGNOSIS — J309 Allergic rhinitis, unspecified: Secondary | ICD-10-CM

## 2013-04-16 DIAGNOSIS — T7840XD Allergy, unspecified, subsequent encounter: Secondary | ICD-10-CM

## 2013-04-16 DIAGNOSIS — Z01419 Encounter for gynecological examination (general) (routine) without abnormal findings: Secondary | ICD-10-CM

## 2013-04-16 DIAGNOSIS — Z5189 Encounter for other specified aftercare: Secondary | ICD-10-CM

## 2013-04-16 DIAGNOSIS — H9193 Unspecified hearing loss, bilateral: Secondary | ICD-10-CM

## 2013-04-16 DIAGNOSIS — H9190 Unspecified hearing loss, unspecified ear: Secondary | ICD-10-CM

## 2013-04-16 HISTORY — DX: Encounter for general adult medical examination without abnormal findings: Z00.00

## 2013-04-16 MED ORDER — FLUTICASONE PROPIONATE 50 MCG/ACT NA SUSP: 2.0000 | Freq: Every day | NASAL | Status: DC

## 2013-04-16 MED ORDER — HYDROCODONE-HOMATROPINE 5-1.5 MG/5ML PO SYRP: 5.0000 mL | ORAL_SOLUTION | Freq: Three times a day (TID) | ORAL | Status: DC | PRN

## 2013-04-16 NOTE — Progress Notes (Signed)
   Subjective:    Patient ID: Scott Schultz, male    DOB: 09/30/1955, 58 y.o.   MRN: [id number]  HPI Kaison is a 58 year old married male nonsmoker who comes today for general physical examination  He's always been in excellent health he said no chronic health problems. He uses a steroid nasal spray on a when necessary basis  He gets routine eye care, dental care, colonoscopy normal, vaccinations up-to-date   Review of Systems  Constitutional: Negative.   HENT: Negative.   Eyes: Negative.   Respiratory: Negative.   Cardiovascular: Negative.   Gastrointestinal: Negative.   Endocrine: Negative.   Genitourinary: Negative.   Musculoskeletal: Negative.   Skin: Negative.   Allergic/Immunologic: Negative.   Neurological: Negative.   Hematological: Negative.   Psychiatric/Behavioral: Negative.        Objective:   Physical Exam  Nursing note and vitals reviewed. Constitutional: He is oriented to person, place, and time. He appears well-developed and well-nourished.  HENT:  Head: Normocephalic and atraumatic.  Right Ear: External ear normal.  Left Ear: External ear normal.  Nose: Nose normal.  Mouth/Throat: Oropharynx is clear and moist.  Eyes: Conjunctivae and EOM are normal. Pupils are equal, round, and reactive to light.  Neck: Normal range of motion. Neck supple. No JVD present. No tracheal deviation present. No thyromegaly present.  Cardiovascular: Normal rate, regular rhythm, normal heart sounds and intact distal pulses.  Exam reveals no gallop and no friction rub.   No murmur heard. No carotid or he bruits peripheral pulses 2+ and symmetrical  Pulmonary/Chest: Effort normal and breath sounds normal. No stridor. No respiratory distress. He has no wheezes. He has no rales. He exhibits no tenderness.  Abdominal: Soft. Bowel sounds are normal. He exhibits no distension and no mass. There is no tenderness. There is no rebound and no guarding.  Genitourinary: Rectum normal,  prostate normal and penis normal. Guaiac negative stool. No penile tenderness.  Musculoskeletal: Normal range of motion. He exhibits no edema and no tenderness.  Lymphadenopathy:    He has no cervical adenopathy.  Neurological: He is alert and oriented to person, place, and time. He has normal reflexes. No cranial nerve deficit. He exhibits normal muscle tone.  Skin: Skin is warm and dry. No rash noted. No erythema. No pallor.  Psychiatric: He has a normal mood and affect. His behavior is normal. Judgment and thought content normal.          Assessment & Plan:  Healthy male  Allergic rhinitis steroid nasal spray when necessary

## 2013-04-16 NOTE — Progress Notes (Signed)
Pre visit review using our clinic review tool, if applicable. No additional management support is needed unless otherwise documented below in the visit note. 

## 2013-04-16 NOTE — Patient Instructions (Signed)
Continue your excellent health habits  Return in one year for general physical sooner if any problems

## 2013-09-24 ENCOUNTER — Ambulatory Visit (INDEPENDENT_AMBULATORY_CARE_PROVIDER_SITE_OTHER): Payer: PRIVATE HEALTH INSURANCE | Admitting: Family Medicine

## 2013-09-24 ENCOUNTER — Encounter: Payer: Self-pay | Admitting: Family Medicine

## 2013-09-24 VITALS — BP 110/70 | Temp 98.3°F | Wt 182.0 lb

## 2013-09-24 DIAGNOSIS — L255 Unspecified contact dermatitis due to plants, except food: Secondary | ICD-10-CM

## 2013-09-24 MED ORDER — PREDNISONE 20 MG PO TABS: ORAL_TABLET | ORAL | Status: DC

## 2013-09-24 NOTE — Patient Instructions (Signed)
Prednisone 20 mg.......... 2 tabs x3 days then taper as outlined  Return when necessary

## 2013-09-24 NOTE — Progress Notes (Signed)
   Subjective:    Patient ID: Scott Schultz, male    DOB: 01-24-1956, 58 y.o.   MRN: [id number]  HPI  [name] is a 58 year old married male who comes in today for evaluation of a contact dermatitis  He developed a contact endometritis on his arms and legs and not spread all over his body  Review of Systems    review of systems otherwise negative Objective:   Physical Exam  Well-developed well-nourished male no acute distress vital signs stable he is afebrile examination skin shows multiple areas of lesions red. He carries consistent with contact dermatitis      Assessment & Plan:  Contact dermatitis............Marland Kitchen prednisone burst and taper.

## 2013-09-24 NOTE — Progress Notes (Signed)
Pre visit review using our clinic review tool, if applicable. No additional management support is needed unless otherwise documented below in the visit note. 

## 2013-10-29 ENCOUNTER — Encounter: Payer: Self-pay | Admitting: Family Medicine

## 2013-10-29 ENCOUNTER — Ambulatory Visit (INDEPENDENT_AMBULATORY_CARE_PROVIDER_SITE_OTHER): Payer: PRIVATE HEALTH INSURANCE | Admitting: Family Medicine

## 2013-10-29 VITALS — BP 110/70 | HR 64 | Temp 97.5°F | Wt 182.0 lb

## 2013-10-29 DIAGNOSIS — L255 Unspecified contact dermatitis due to plants, except food: Secondary | ICD-10-CM

## 2013-10-29 DIAGNOSIS — J0101 Acute recurrent maxillary sinusitis: Secondary | ICD-10-CM

## 2013-10-29 DIAGNOSIS — J0100 Acute maxillary sinusitis, unspecified: Secondary | ICD-10-CM

## 2013-10-29 MED ORDER — PREDNISONE 20 MG PO TABS: ORAL_TABLET | ORAL | Status: DC

## 2013-10-29 NOTE — Progress Notes (Signed)
Pre visit review using our clinic review tool, if applicable. No additional management support is needed unless otherwise documented below in the visit note. 

## 2013-10-29 NOTE — Patient Instructions (Signed)
Sinusitis Sinusitis is redness, soreness, and swelling (inflammation) of the paranasal sinuses. Paranasal sinuses are air pockets within the bones of your face (beneath the eyes, the middle of the forehead, or above the eyes). In healthy paranasal sinuses, mucus is able to drain out, and air is able to circulate through them by way of your nose. However, when your paranasal sinuses are inflamed, mucus and air can become trapped. This can allow bacteria and other germs to grow and cause infection. Sinusitis can develop quickly and last only a short time (acute) or continue over a long period (chronic). Sinusitis that lasts for more than 12 weeks is considered chronic.  CAUSES  Causes of sinusitis include:  Allergies.  Structural abnormalities, such as displacement of the cartilage that separates your nostrils (deviated septum), which can decrease the air flow through your nose and sinuses and affect sinus drainage.  Functional abnormalities, such as when the small hairs (cilia) that line your sinuses and help remove mucus do not work properly or are not present. SYMPTOMS  Symptoms of acute and chronic sinusitis are the same. The primary symptoms are pain and pressure around the affected sinuses. Other symptoms include:  Upper toothache.  Earache.  Headache.  Bad breath.  Decreased sense of smell and taste.  A cough, which worsens when you are lying flat.  Fatigue.  Fever.  Thick drainage from your nose, which often is green and may contain pus (purulent).  Swelling and warmth over the affected sinuses. DIAGNOSIS  Your caregiver will perform a physical exam. During the exam, your caregiver may:  Look in your nose for signs of abnormal growths in your nostrils (nasal polyps).  Tap over the affected sinus to check for signs of infection.  View the inside of your sinuses (endoscopy) with a special imaging device with a light attached (endoscope), which is inserted into your  sinuses. If your caregiver suspects that you have chronic sinusitis, one or more of the following tests may be recommended:  Allergy tests.  Nasal culture--A sample of mucus is taken from your nose and sent to a lab and screened for bacteria.  Nasal cytology--A sample of mucus is taken from your nose and examined by your caregiver to determine if your sinusitis is related to an allergy. TREATMENT  Most cases of acute sinusitis are related to a viral infection and will resolve on their own within 10 days. Sometimes medicines are prescribed to help relieve symptoms (pain medicine, decongestants, nasal steroid sprays, or saline sprays).  However, for sinusitis related to a bacterial infection, your caregiver will prescribe antibiotic medicines. These are medicines that will help kill the bacteria causing the infection.  Rarely, sinusitis is caused by a fungal infection. In theses cases, your caregiver will prescribe antifungal medicine. For some cases of chronic sinusitis, surgery is needed. Generally, these are cases in which sinusitis recurs more than 3 times per year, despite other treatments. HOME CARE INSTRUCTIONS   Drink plenty of water. Water helps thin the mucus so your sinuses can drain more easily.  Use a humidifier.  Inhale steam 3 to 4 times a day (for example, sit in the bathroom with the shower running).  Apply a warm, moist washcloth to your face 3 to 4 times a day, or as directed by your caregiver.  Use saline nasal sprays to help moisten and clean your sinuses.  Take over-the-counter or prescription medicines for pain, discomfort, or fever only as directed by your caregiver. SEEK IMMEDIATE MEDICAL CARE IF:    You have increasing pain or severe headaches.  You have nausea, vomiting, or drowsiness.  You have swelling around your face.  You have vision problems.  You have a stiff neck.  You have difficulty breathing. MAKE SURE YOU:   Understand these  instructions.  Will watch your condition.  Will get help right away if you are not doing well or get worse. Document Released: 03/29/2005 Document Revised: 06/21/2011 Document Reviewed: 04/13/2011 [employer] Patient Information 2015 [employer]. This information is not intended to replace advice given to you by your health care provider. Make sure you discuss any questions you have with your health care provider.  

## 2013-10-29 NOTE — Progress Notes (Signed)
   Garret Reddish, MD Phone: [phone]  Subjective:    Scott Schultz is a 58 y.o. male who presents for evaluation of sinus pain. Symptoms include: congestion, cough, facial pain, headaches and post nasal drip. Onset of symptoms was 5 days ago. Symptoms have been gradually worsening since that time. Past history is significant for no history of pneumonia or bronchitis and 2-3 episodes sinusitis yearly improved to 1-2x yearly on Nasocort and typically treated with prednisone with good relief.   ROS- no fever/chills. Admits to worsening fatigue.  Past Medical History- seasonal allergies, reactive airway disease history with history wheezing, hearing loss with hearing aids Medications- Flonase only prior to visit   Objective: BP 110/70  Pulse 64  Temp(Src) 97.5 F (36.4 C) (Oral)  Wt 182 lb (82.555 kg) Gen: NAD, resting comfortably on table, fatigued appearing HEENT: nares erythematous and swollen, oropharynx with mild erythema but without pharyngeal exudate, TM normal bilaterally, Mucous membranes are moist. Maxillary sinus tenderness noted.  CV: RRR no murmurs rubs or gallops Lungs: CTAB no crackles, wheeze, rhonchi Ext: no edema Skin: warm, dry, no rash  Neuro: CN II-XII intact, sensation and reflexes normal throughout, 5/5 muscle strength in bilateral upper and lower extremities. Normal gait  Assessment/Plan:  Acute Sinusitis (likely viral) Continue use of flonase. Added prednisone taper to regimen as this has been very beneficial in past and symptoms have continued to worsen through day 5. Discussed reasons for return.   Meds ordered this encounter  Medications  . predniSONE (DELTASONE) 20 MG tablet    Sig: 2 tabs x3 days, 1 tab x3 days, a half a tab x3 days, then a half a tab Monday Wednesday Friday for a two-week taper    Dispense:  30 tablet    Refill:  0

## 2014-02-26 ENCOUNTER — Ambulatory Visit (INDEPENDENT_AMBULATORY_CARE_PROVIDER_SITE_OTHER): Payer: BC Managed Care – PPO | Admitting: *Deleted

## 2014-02-26 ENCOUNTER — Ambulatory Visit: Payer: BC Managed Care – PPO

## 2014-02-26 DIAGNOSIS — Z23 Encounter for immunization: Secondary | ICD-10-CM

## 2014-03-18 ENCOUNTER — Ambulatory Visit (INDEPENDENT_AMBULATORY_CARE_PROVIDER_SITE_OTHER)
Admission: RE | Admit: 2014-03-18 | Discharge: 2014-03-18 | Disposition: A | Discharge status: Home / Self Care | Payer: BC Managed Care – PPO | Source: Ambulatory Visit | Attending: Family Medicine | Admitting: Family Medicine

## 2014-03-18 ENCOUNTER — Other Ambulatory Visit: Payer: Self-pay | Admitting: Family Medicine

## 2014-03-18 ENCOUNTER — Ambulatory Visit (INDEPENDENT_AMBULATORY_CARE_PROVIDER_SITE_OTHER): Payer: BC Managed Care – PPO | Admitting: Family Medicine

## 2014-03-18 ENCOUNTER — Encounter: Payer: Self-pay | Admitting: Family Medicine

## 2014-03-18 VITALS — BP 110/70 | Temp 98.1°F | Wt 180.0 lb

## 2014-03-18 DIAGNOSIS — J111 Influenza due to unidentified influenza virus with other respiratory manifestations: Secondary | ICD-10-CM

## 2014-03-18 DIAGNOSIS — J1189 Influenza due to unidentified influenza virus with other manifestations: Secondary | ICD-10-CM

## 2014-03-18 HISTORY — DX: Influenza due to unidentified influenza virus with other respiratory manifestations: J11.1

## 2014-03-18 MED ORDER — HYDROCODONE-HOMATROPINE 5-1.5 MG/5ML PO SYRP: 5.0000 mL | ORAL_SOLUTION | Freq: Three times a day (TID) | ORAL | Status: DC | PRN

## 2014-03-18 MED ORDER — AMOXICILLIN 875 MG PO TABS: 875.0000 mg | ORAL_TABLET | Freq: Two times a day (BID) | ORAL | Status: DC

## 2014-03-18 NOTE — Patient Instructions (Signed)
Tylenol or aspirin............. 2 tabs 3 times daily for fever chills and muscle aches  Drink lots of water  Go to the main office now for sinus x-rays........ I will call you the report ASAP  Hydromet.......Marland Kitchen 1/2-1 teaspoon 3 times daily when necessary for cough and cold  Afrin nasal spray,,,,,,, one shot up each nostril at bedtime,,,,, 5 night limit

## 2014-03-18 NOTE — Progress Notes (Signed)
Pre visit review using our clinic review tool, if applicable. No additional management support is needed unless otherwise documented below in the visit note. 

## 2014-03-18 NOTE — Progress Notes (Signed)
   Subjective:    Patient ID: Scott Schultz, male    DOB: [birth year], 58 y.o.   MRN: [id number]  HPI Scott Schultz is a 58 year old married male nonsmoker who comes in today with a flu  He said he felt okay and fell last Wednesday when he developed head congestion postnasal drip cough and then developed fever and chills. The fever finally stopped on yesterday. He has a lot of head congestion postnasal drip and cough. He thinks he might have a sinus infection. He's never had sinus surgery. He has been treated at urgent cares with antibiotics for sinusitis without documentation   Review of Systems    review of systems otherwise negative Objective:   Physical Exam  Well-developed well-nourished male no acute distress vital signs stable he is afebrile HEENT were negative neck was supple no adenopathy lungs are clear      Assessment & Plan:  Influenza........ treat symptomatically with fluids Tylenol cough syrup..... Sinus x-rays rule out sinusitis

## 2014-03-20 ENCOUNTER — Telehealth: Payer: Self-pay | Admitting: *Deleted

## 2014-03-20 NOTE — Telephone Encounter (Signed)
No per Dr Sherren Mocha.  Patient is aware.

## 2014-03-20 NOTE — Telephone Encounter (Signed)
Patient would like to know if he should restart his prednisone?

## 2014-03-25 ENCOUNTER — Encounter: Payer: Self-pay | Admitting: Family Medicine

## 2014-03-26 MED ORDER — PREDNISONE 20 MG PO TABS: 20.0000 mg | ORAL_TABLET | Freq: Every day | ORAL | Status: DC

## 2014-04-01 ENCOUNTER — Encounter: Payer: Self-pay | Admitting: Family Medicine

## 2014-04-01 ENCOUNTER — Ambulatory Visit (INDEPENDENT_AMBULATORY_CARE_PROVIDER_SITE_OTHER): Payer: BC Managed Care – PPO | Admitting: Family Medicine

## 2014-04-01 VITALS — BP 110/68 | Temp 97.5°F | Wt 179.0 lb

## 2014-04-01 DIAGNOSIS — R350 Frequency of micturition: Secondary | ICD-10-CM

## 2014-04-01 HISTORY — DX: Frequency of micturition: R35.0

## 2014-04-01 LAB — POCT URINALYSIS DIPSTICK
Bilirubin, UA: NEGATIVE
Blood, UA: NEGATIVE
Glucose, UA: NEGATIVE
Ketones, UA: NEGATIVE
Leukocytes, UA: NEGATIVE
Nitrite, UA: NEGATIVE
Protein, UA: NEGATIVE
Spec Grav, UA: 1.015
Urobilinogen, UA: 0.2
pH, UA: 6.5

## 2014-04-01 NOTE — Progress Notes (Signed)
Pre visit review using our clinic review tool, if applicable. No additional management support is needed unless otherwise documented below in the visit note. 

## 2014-04-01 NOTE — Progress Notes (Signed)
   Subjective:    Patient ID: Scott Schultz, male    DOB: Apr 15, 1955, 58 y.o.   MRN: [id number]  HPI Holdyn is a 58 year old male married nonsmoker who comes in today for evaluation of urinary frequency  He has rather severe allergic rhinitis and started prednisone last Monday. On Thursday he began having urinary frequency with nocturia 3 or 4. No fever chills or back pain. No history of prostatitis in the past   Review of Systems    review of systems otherwise negative Objective:   Physical Exam  Well-developed well-nourished male no acute distress vital signs stable is afebrile urinalysis normal      Assessment & Plan:  Urinary frequency secondary to prednisone........... taper off prednisone return when necessary

## 2014-04-01 NOTE — Patient Instructions (Signed)
Taper the prednisone  Drink lots of water  Return when necessary

## 2014-04-16 ENCOUNTER — Other Ambulatory Visit (INDEPENDENT_AMBULATORY_CARE_PROVIDER_SITE_OTHER): Payer: BC Managed Care – PPO

## 2014-04-16 DIAGNOSIS — Z0000 Encounter for general adult medical examination without abnormal findings: Secondary | ICD-10-CM

## 2014-04-16 LAB — CBC WITH DIFFERENTIAL/PLATELET
Basophils Absolute: 0 10*3/uL (ref 0.0–0.1)
Basophils Relative: 0.6 % (ref 0.0–3.0)
Eosinophils Absolute: 0.3 10*3/uL (ref 0.0–0.7)
Eosinophils Relative: 4.6 % (ref 0.0–5.0)
HCT: 44.6 % (ref 39.0–52.0)
Hemoglobin: 14.7 g/dL (ref 13.0–17.0)
Lymphocytes Relative: 30.5 % (ref 12.0–46.0)
Lymphs Abs: 1.8 10*3/uL (ref 0.7–4.0)
MCHC: 32.9 g/dL (ref 30.0–36.0)
MCV: 90.4 fl (ref 78.0–100.0)
Monocytes Absolute: 0.6 10*3/uL (ref 0.1–1.0)
Monocytes Relative: 9.5 % (ref 3.0–12.0)
Neutro Abs: 3.3 10*3/uL (ref 1.4–7.7)
Neutrophils Relative %: 54.8 % (ref 43.0–77.0)
Platelets: 183 10*3/uL (ref 150.0–400.0)
RBC: 4.93 Mil/uL (ref 4.22–5.81)
RDW: 14.2 % (ref 11.5–15.5)
WBC: 6 10*3/uL (ref 4.0–10.5)

## 2014-04-16 LAB — LIPID PANEL
Cholesterol: 264 mg/dL — ABNORMAL HIGH (ref 0–200)
HDL: 87.4 mg/dL (ref >39.00)
LDL Cholesterol: 171 mg/dL — ABNORMAL HIGH (ref 0–99)
NonHDL: 176.6
Total CHOL/HDL Ratio: 3
Triglycerides: 27 mg/dL (ref 0.0–149.0)
VLDL: 5.4 mg/dL (ref 0.0–40.0)

## 2014-04-16 LAB — HEPATIC FUNCTION PANEL
ALT: 17 U/L (ref 0–53)
AST: 22 U/L (ref 0–37)
Albumin: 4.3 g/dL (ref 3.5–5.2)
Alkaline Phosphatase: 45 U/L (ref 39–117)
Bilirubin, Direct: 0.2 mg/dL (ref 0.0–0.3)
Total Bilirubin: 1.1 mg/dL (ref 0.2–1.2)
Total Protein: 6.8 g/dL (ref 6.0–8.3)

## 2014-04-16 LAB — BASIC METABOLIC PANEL
BUN: 15 mg/dL (ref 6–23)
CO2: 29 mEq/L (ref 19–32)
Calcium: 9.4 mg/dL (ref 8.4–10.5)
Chloride: 107 mEq/L (ref 96–112)
Creatinine, Ser: 0.9 mg/dL (ref 0.4–1.5)
GFR: 97.06 mL/min (ref >60.00)
Glucose, Bld: 96 mg/dL (ref 70–99)
Potassium: 4.6 mEq/L (ref 3.5–5.1)
Sodium: 142 mEq/L (ref 135–145)

## 2014-04-16 LAB — POCT URINALYSIS DIPSTICK
Bilirubin, UA: NEGATIVE
Blood, UA: NEGATIVE
Glucose, UA: NEGATIVE
Ketones, UA: NEGATIVE
Leukocytes, UA: NEGATIVE
Nitrite, UA: NEGATIVE
Protein, UA: NEGATIVE
Spec Grav, UA: 1.02
Urobilinogen, UA: 0.2
pH, UA: 6

## 2014-04-16 LAB — PSA: PSA: 0.74 ng/mL (ref 0.10–4.00)

## 2014-04-16 LAB — TSH: TSH: 0.93 u[IU]/mL (ref 0.35–4.50)

## 2014-04-22 ENCOUNTER — Encounter: Payer: Self-pay | Admitting: Family Medicine

## 2014-04-22 ENCOUNTER — Ambulatory Visit (INDEPENDENT_AMBULATORY_CARE_PROVIDER_SITE_OTHER): Payer: BC Managed Care – PPO | Admitting: Family Medicine

## 2014-04-22 VITALS — BP 110/70 | Temp 97.8°F | Ht 74.0 in | Wt 183.0 lb

## 2014-04-22 DIAGNOSIS — Z0000 Encounter for general adult medical examination without abnormal findings: Secondary | ICD-10-CM

## 2014-04-22 DIAGNOSIS — H9193 Unspecified hearing loss, bilateral: Secondary | ICD-10-CM

## 2014-04-22 NOTE — Progress Notes (Signed)
   Subjective:    Patient ID: Scott Schultz, male    DOB: [birth year], 59 y.o.   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 who comes in today for general physical examination  He's had a history of hearing loss and wears bilateral hearing aids otherwise she's been Health and no problems  He gets routine eye care, dental care, colonoscopy in early 59s normal, vaccinations up-to-date   Review of Systems  Constitutional: Negative.   HENT: Negative.   Eyes: Negative.   Respiratory: Negative.   Cardiovascular: Negative.   Gastrointestinal: Negative.   Endocrine: Negative.   Genitourinary: Negative.   Musculoskeletal: Negative.   Skin: Negative.   Allergic/Immunologic: Negative.   Neurological: Negative.   Hematological: Negative.   Psychiatric/Behavioral: Negative.        Objective:   Physical Exam  Constitutional: He is oriented to person, place, and time. He appears well-developed and well-nourished.  HENT:  Head: Normocephalic and atraumatic.  Right Ear: External ear normal.  Left Ear: External ear normal.  Nose: Nose normal.  Mouth/Throat: Oropharynx is clear and moist.  Eyes: Conjunctivae and EOM are normal. Pupils are equal, round, and reactive to light.  Neck: Normal range of motion. Neck supple. No JVD present. No tracheal deviation present. No thyromegaly present.  Cardiovascular: Normal rate, regular rhythm, normal heart sounds and intact distal pulses.  Exam reveals no gallop and no friction rub.   No murmur heard. Pulmonary/Chest: Effort normal and breath sounds normal. No stridor. No respiratory distress. He has no wheezes. He has no rales. He exhibits no tenderness.  Abdominal: Soft. Bowel sounds are normal. He exhibits no distension and no mass. There is no tenderness. There is no rebound and no guarding.  Genitourinary: Rectum normal, prostate normal and penis normal. Guaiac negative stool. No  penile tenderness.  Musculoskeletal: Normal range of motion. He exhibits no edema or tenderness.  Lymphadenopathy:    He has no cervical adenopathy.  Neurological: He is alert and oriented to person, place, and time. He has normal reflexes. No cranial nerve deficit. He exhibits normal muscle tone.  Skin: Skin is warm and dry. No rash noted. No erythema. No pallor.  Psychiatric: He has a normal mood and affect. His behavior is normal. Judgment and thought content normal.  Nursing note and vitals reviewed.         Assessment & Plan:Healthy male  Hearing loss continue hearing aids return when necessary   Healthy male  Hearing loss.......... continue hearing aids  Return when necessary

## 2014-04-22 NOTE — Progress Notes (Signed)
Pre visit review using our clinic review tool, if applicable. No additional management support is needed unless otherwise documented below in the visit note. 

## 2014-04-22 NOTE — Patient Instructions (Signed)
Continue your good health habits  Return in one year sooner if any problems

## 2014-06-19 ENCOUNTER — Telehealth: Payer: Self-pay | Admitting: Family Medicine

## 2014-06-19 NOTE — Telephone Encounter (Signed)
Pt would like a refill on prednisone call into [address]

## 2014-06-20 MED ORDER — PREDNISONE 20 MG PO TABS: 20.0000 mg | ORAL_TABLET | Freq: Every day | ORAL | Status: DC

## 2014-06-20 NOTE — Telephone Encounter (Signed)
Okay per Dr Sherren Mocha.  rx sent.

## 2014-12-04 ENCOUNTER — Encounter: Payer: Self-pay | Admitting: Family Medicine

## 2014-12-19 ENCOUNTER — Encounter: Payer: Self-pay | Admitting: Adult Health

## 2014-12-19 ENCOUNTER — Ambulatory Visit (INDEPENDENT_AMBULATORY_CARE_PROVIDER_SITE_OTHER): Payer: BC Managed Care – PPO | Admitting: Adult Health

## 2014-12-19 DIAGNOSIS — Z23 Encounter for immunization: Secondary | ICD-10-CM

## 2014-12-19 DIAGNOSIS — L729 Follicular cyst of the skin and subcutaneous tissue, unspecified: Secondary | ICD-10-CM

## 2014-12-19 MED ORDER — CEPHALEXIN 500 MG PO CAPS: 500.0000 mg | ORAL_CAPSULE | Freq: Three times a day (TID) | ORAL | Status: DC

## 2014-12-19 NOTE — Patient Instructions (Addendum)
It was great meeting you today!  I have sent in a prescription for Keflex, please take this three times a day for 5 days. If you notice any signs of infection, please let me know.    Wait 3 days before getting in the pool. Keep the wound covered with antibiotic ointment.   Follow up as needed  Cyst Removal Your caregiver has removed a cyst. A cyst is a sac containing a semi-solid material. Cysts may occur any place on your body. They may remain small for years or gradually get larger. A sebaceous cyst is an enlarged (dilated) sweat gland filled with old sweat (sebum). Unattended, these may become large (the size of a softball) over several years time. These are often removed for improved appearance (cosmetic) reasons or before they become infected to form an abscess. An abscess is an infected cyst. HOME CARE INSTRUCTIONS   Keep your bandage clean and dry. You may change your bandage after 24 hours. If your bandage sticks, use warm water to gently loosen it. Pat the area dry with a clean towel before putting on another bandage.  If possible, keep the area where the cyst was removed raised to relieve soreness, swelling, and promote healing.  If you have stitches, keep them clean and dry.  You may clean your stitches gently with a cotton swab dipped in warm soapy water.  Do not soak the area where the cyst was removed or go swimming. You may shower.  Do not overuse the area where your cyst was removed.  Return in 7 days or as directed to have your stitches removed.  Take medicines as instructed by your caregiver. SEEK IMMEDIATE MEDICAL CARE IF:   An oral temperature above 102 F (38.9 C) develops, not controlled by medication.  Blood continues to soak through the bandage.  You have increasing pain in the area where your cyst was removed.  You have redness, swelling, pus, a bad smell, soreness (inflammation), or red streaks coming away from the stitches. These are signs of  infection. MAKE SURE YOU:   Understand these instructions.  Will watch your condition.  Will get help right away if you are not doing well or get worse. Document Released: 03/26/2000 Document Revised: 06/21/2011 Document Reviewed: 07/20/2007 [facility] Patient Information 2015 [place], [place]. This information is not intended to replace advice given to you by your health care provider. Make sure you discuss any questions you have with your health care provider.

## 2014-12-19 NOTE — Progress Notes (Signed)
Pre visit review using our clinic review tool, if applicable. No additional management support is needed unless otherwise documented below in the visit note. 

## 2014-12-19 NOTE — Progress Notes (Signed)
   Subjective:    Patient ID: Scott Schultz, male    DOB: Sep 22, 1955, 59 y.o.   MRN: [id number]  HPI  59 year old male who presents to the office today for cyst on the left side of his head, he would like this removed. His wife has been trying to remove the cyst at home but has only been able to get pus to drain from it. Endorses only soreness. No signs of infection   Review of Systems  Constitutional: Negative.   Skin: Negative for color change, pallor, rash and wound.       Cyst on left side of head  All other systems reviewed and are negative.  Past Medical History  Diagnosis Date  . Hearing loss   . Sinusitis     Social History   Social History  . Marital Status: Married    Spouse Name: N/A  . Number of Children: N/A  . Years of Education: N/A   Occupational History  . Not on file.   Social History Main Topics  . Smoking status: Never Smoker   . Smokeless tobacco: Never Used  . Alcohol Use: Yes  . Drug Use: No  . Sexual Activity: Not on file   Other Topics Concern  . Not on file   Social History Narrative   Occupation:   Married   Never Smoked   Alcohol use- yes   Regular exercise- yes          No past surgical history on file.  Family History  Problem Relation Age of Onset  . Healthy Mother   . Healthy Father   . Healthy Brother   . Healthy Brother   . Healthy Brother   . Healthy Brother     No Known Allergies  Current Outpatient Prescriptions on File Prior to Visit  Medication Sig Dispense Refill  . fluticasone (FLONASE) 50 MCG/ACT nasal spray Place 2 sprays into both nostrils daily. 16 g 6   No current facility-administered medications on file prior to visit.    BP 100/72 mmHg  Temp(Src) 98.4 F (36.9 C) (Oral)  Ht 6\' 2"  (1.88 m)  Wt 186 lb 9.6 oz (84.641 kg)  BMI 23.95 kg/m2       Objective:   Physical Exam  Constitutional: He is oriented to person, place, and time. He appears well-developed and well-nourished. No  distress.  HENT:  Head:    Neurological: He is alert and oriented to person, place, and time.  Skin: Skin is warm and dry. No rash noted. He is not diaphoretic. No erythema. No pallor.  Psychiatric: He has a normal mood and affect. His behavior is normal. Judgment and thought content normal.  Nursing note and vitals reviewed.      Assessment & Plan:  1. Pilar cysts Procedure:  Incision and drainage of abscess Risks, benefits, and alternatives explained and consent obtained. Time out conducted. Surface cleaned with alcohol and betadine  1cc lidocaine with epinephine infiltrated around cyst Adequate anesthesia ensured. Area prepped and draped in a sterile fashion. #15 blade used to make a horizontal incision into cyst Curved hemostat used to explore 4 quadrants and loculations broken up.Cyst with sac intact was removed. No Further purulence expressed. Hemostasis achieved. Pt stable. Aftercare and follow-up advised. - Since his wife had tried to manipulate cyst at home. Keflex 500mg  TID x 5 days was prescribed.

## 2015-01-17 ENCOUNTER — Encounter: Payer: Self-pay | Admitting: Gastroenterology

## 2015-02-21 ENCOUNTER — Ambulatory Visit (INDEPENDENT_AMBULATORY_CARE_PROVIDER_SITE_OTHER): Payer: BC Managed Care – PPO | Admitting: Family Medicine

## 2015-02-21 ENCOUNTER — Encounter: Payer: Self-pay | Admitting: Family Medicine

## 2015-02-21 VITALS — BP 97/58 | HR 58 | Temp 98.5°F | Ht 74.0 in | Wt 190.0 lb

## 2015-02-21 DIAGNOSIS — L723 Sebaceous cyst: Secondary | ICD-10-CM

## 2015-02-21 DIAGNOSIS — H01139 Eczematous dermatitis of unspecified eye, unspecified eyelid: Secondary | ICD-10-CM

## 2015-02-21 MED ORDER — TRIAMCINOLONE ACETONIDE 0.1 % EX OINT: 1.0000 "application " | TOPICAL_OINTMENT | Freq: Two times a day (BID) | CUTANEOUS | Status: DC

## 2015-02-21 NOTE — Progress Notes (Signed)
Pre visit review using our clinic review tool, if applicable. No additional management support is needed unless otherwise documented below in the visit note. 

## 2015-02-24 ENCOUNTER — Encounter: Payer: Self-pay | Admitting: Family Medicine

## 2015-02-24 NOTE — Progress Notes (Signed)
   Subjective:    Patient ID: Scott Schultz, male    DOB: 12-11-1955, 60 y.o.   MRN: [id number]  HPI Here for patches of itchy dry skin that have been bothering him for a month or so on the face. Also he has a hx of sebaceous cysts on the scalp, and he has another one now tat is mildly tender.    Review of Systems  Constitutional: Negative.   Respiratory: Negative.   Cardiovascular: Negative.   Skin: Positive for rash.       Objective:   Physical Exam  Constitutional: He appears well-developed and well-nourished.  Cardiovascular: Normal rate, regular rhythm, normal heart sounds and intact distal pulses.   Pulmonary/Chest: Effort normal and breath sounds normal.  Skin:  The skin on the face around both eyes is red and slightly puffy.  He has as a small tender cyst on the vertex of the scalp          Assessment & Plan:  Treat the eczema with Triamcinolone ointment. The cyst was lanced with a scalpel and a tiny bit of clear fluid was expressed. Recheck prn.

## 2015-06-06 ENCOUNTER — Encounter: Payer: Self-pay | Admitting: Adult Health

## 2015-06-06 ENCOUNTER — Ambulatory Visit (INDEPENDENT_AMBULATORY_CARE_PROVIDER_SITE_OTHER): Payer: BC Managed Care – PPO | Admitting: Adult Health

## 2015-06-06 VITALS — BP 110/72 | HR 91 | Temp 98.4°F | Wt 195.5 lb

## 2015-06-06 DIAGNOSIS — J0121 Acute recurrent ethmoidal sinusitis: Secondary | ICD-10-CM | POA: Diagnosis not present

## 2015-06-06 MED ORDER — PREDNISONE 10 MG PO TABS: 10.0000 mg | ORAL_TABLET | Freq: Every day | ORAL | Status: DC

## 2015-06-06 MED ORDER — DOXYCYCLINE HYCLATE 100 MG PO CAPS: 100.0000 mg | ORAL_CAPSULE | Freq: Two times a day (BID) | ORAL | Status: DC

## 2015-06-06 MED ORDER — HYDROCODONE-HOMATROPINE 5-1.5 MG/5ML PO SYRP: 5.0000 mL | ORAL_SOLUTION | Freq: Three times a day (TID) | ORAL | Status: DC | PRN

## 2015-06-06 NOTE — Patient Instructions (Signed)
It was great seeing you again  I have sent a prescription to the pharmacy for prednisone and doxycycline. Take the prednisone as directed:  40 mg x 3 days 20 mg x 3 days 10 mg x 3 days  Use the cough syrup at night as it will make you sleepy.   Use Mucinex during the day   General Recommendations:    Please drink plenty of fluids.  Get plenty of rest   Sleep in humidified air  Use saline nasal sprays  Netti pot   OTC Medications:  Decongestants - helps relieve congestion   Flonase (generic fluticasone) or Nasacort (generic triamcinolone) - please make sure to use the "cross-over" technique at a 45 degree angle towards the opposite eye as opposed to straight up the nasal passageway.   Sudafed (generic pseudoephedrine - Note this is the one that is available behind the pharmacy counter); Products with phenylephrine (-PE) may also be used but is often not as effective as pseudoephedrine.   If you have HIGH BLOOD PRESSURE - Coricidin HBP; AVOID any product that is -D as this contains pseudoephedrine which may increase your blood pressure.  Afrin (oxymetazoline) every 6-8 hours for up to 3 days.   Allergies - helps relieve runny nose, itchy eyes and sneezing   Claritin (generic loratidine), Allegra (fexofenidine), or Zyrtec (generic cyrterizine) for runny nose. These medications should not cause drowsiness.  Note - Benadryl (generic diphenhydramine) may be used however may cause drowsiness  Cough -   Delsym or Robitussin (generic dextromethorphan)  Expectorants - helps loosen mucus to ease removal   Mucinex (generic guaifenesin) as directed on the package.  Headaches / General Aches   Tylenol (generic acetaminophen) - DO NOT EXCEED 3 grams (3,000 mg) in a 24 hour time period  Advil/Motrin (generic ibuprofen)   Sore Throat -   Salt water gargle   Chloraseptic (generic benzocaine) spray or lozenges / Sucrets (generic dyclonine)    Sinusitis Sinusitis is  redness, soreness, and inflammation of the paranasal sinuses. Paranasal sinuses are air pockets within the bones of your face (beneath the eyes, the middle of the forehead, or above the eyes). In healthy paranasal sinuses, mucus is able to drain out, and air is able to circulate through them by way of your nose. However, when your paranasal sinuses are inflamed, mucus and air can become trapped. This can allow bacteria and other germs to grow and cause infection. Sinusitis can develop quickly and last only a short time (acute) or continue over a long period (chronic). Sinusitis that lasts for more than 12 weeks is considered chronic.  CAUSES  Causes of sinusitis include:  Allergies.  Structural abnormalities, such as displacement of the cartilage that separates your nostrils (deviated septum), which can decrease the air flow through your nose and sinuses and affect sinus drainage.  Functional abnormalities, such as when the small hairs (cilia) that line your sinuses and help remove mucus do not work properly or are not present. SIGNS AND SYMPTOMS  Symptoms of acute and chronic sinusitis are the same. The primary symptoms are pain and pressure around the affected sinuses. Other symptoms include:  Upper toothache.  Earache.  Headache.  Bad breath.  Decreased sense of smell and taste.  A cough, which worsens when you are lying flat.  Fatigue.  Fever.  Thick drainage from your nose, which often is green and may contain pus (purulent).  Swelling and warmth over the affected sinuses. DIAGNOSIS  Your health care provider will  perform a physical exam. During the exam, your health care provider may:  Look in your nose for signs of abnormal growths in your nostrils (nasal polyps).  Tap over the affected sinus to check for signs of infection.  View the inside of your sinuses (endoscopy) using an imaging device that has a light attached (endoscope). If your health care provider suspects  that you have chronic sinusitis, one or more of the following tests may be recommended:  Allergy tests.  Nasal culture. A sample of mucus is taken from your nose, sent to a lab, and screened for bacteria.  Nasal cytology. A sample of mucus is taken from your nose and examined by your health care provider to determine if your sinusitis is related to an allergy. TREATMENT  Most cases of acute sinusitis are related to a viral infection and will resolve on their own within 10 days. Sometimes medicines are prescribed to help relieve symptoms (pain medicine, decongestants, nasal steroid sprays, or saline sprays).  However, for sinusitis related to a bacterial infection, your health care provider will prescribe antibiotic medicines. These are medicines that will help kill the bacteria causing the infection.  Rarely, sinusitis is caused by a fungal infection. In theses cases, your health care provider will prescribe antifungal medicine. For some cases of chronic sinusitis, surgery is needed. Generally, these are cases in which sinusitis recurs more than 3 times per year, despite other treatments. HOME CARE INSTRUCTIONS   Drink plenty of water. Water helps thin the mucus so your sinuses can drain more easily.  Use a humidifier.  Inhale steam 3 to 4 times a day (for example, sit in the bathroom with the shower running).  Apply a warm, moist washcloth to your face 3 to 4 times a day, or as directed by your health care provider.  Use saline nasal sprays to help moisten and clean your sinuses.  Take medicines only as directed by your health care provider.  If you were prescribed either an antibiotic or antifungal medicine, finish it all even if you start to feel better. SEEK IMMEDIATE MEDICAL CARE IF:  You have increasing pain or severe headaches.  You have nausea, vomiting, or drowsiness.  You have swelling around your face.  You have vision problems.  You have a stiff neck.  You have  difficulty breathing. MAKE SURE YOU:   Understand these instructions.  Will watch your condition.  Will get help right away if you are not doing well or get worse. Document Released: 03/29/2005 Document Revised: 08/13/2013 Document Reviewed: 04/13/2011 [facility] Patient Information 2015 [place], [place]. This information is not intended to replace advice given to you by your health care provider. Make sure you discuss any questions you have with your health care provider.

## 2015-06-06 NOTE — Progress Notes (Signed)
   Subjective:    Patient ID: Scott Schultz, male    DOB: [birth year], 60 y.o.   MRN: [id number]  HPI  presents for evaluation of sinus pain. Symptoms include: congestion, cough, facial pain, headaches and post nasal drip. Onset of symptoms was 5 days ago. Symptoms have been gradually worsening since that time. Past history is significant for no history of pneumonia or bronchitis and 2-3 episodes sinusitis yearly improved to 1-2x yearly. Typically treated with prednisone with good relief.   Review of Systems  Constitutional: Positive for fever and fatigue. Negative for chills and diaphoresis.  HENT: Positive for congestion, postnasal drip, rhinorrhea, sinus pressure and sore throat. Negative for ear discharge and ear pain.   Respiratory: Positive for cough. Negative for shortness of breath and wheezing.   Skin: Negative.   Psychiatric/Behavioral: Positive for sleep disturbance.  All other systems reviewed and are negative.      Objective:   Physical Exam  Constitutional: He is oriented to person, place, and time. He appears well-developed and well-nourished. No distress.  HENT:  Head: Normocephalic.  Right Ear: External ear normal.  Left Ear: External ear normal.  Nose: Nose normal.  Mouth/Throat: Oropharynx is clear and moist.  Eyes: Conjunctivae and EOM are normal. Pupils are equal, round, and reactive to light. Right eye exhibits no discharge.  Neck: Normal range of motion. Neck supple.  Cardiovascular: Normal rate, regular rhythm, normal heart sounds and intact distal pulses.  Exam reveals no gallop and no friction rub.   No murmur heard. Pulmonary/Chest: Effort normal and breath sounds normal. No respiratory distress. He has no wheezes. He has no rales. He exhibits no tenderness.  Lymphadenopathy:    He has no cervical adenopathy.  Neurological: He is alert and oriented to person, place, and time.  Skin: Skin is warm and dry. No rash noted. He is not diaphoretic. No  erythema. No pallor.  Psychiatric: He has a normal mood and affect. His behavior is normal. Judgment and thought content normal.  Nursing note and vitals reviewed.      Assessment & Plan:  1. Acute recurrent ethmoidal sinusitis - HYDROcodone-homatropine (HYCODAN) 5-1.5 MG/5ML syrup; Take 5 mLs by mouth every 8 (eight) hours as needed for cough.  Dispense: 120 mL; Refill: 0 - doxycycline (VIBRAMYCIN) 100 MG capsule; Take 1 capsule (100 mg total) by mouth 2 (two) times daily.  Dispense: 14 capsule; Refill: 0 - predniSONE (DELTASONE) 10 MG tablet; Take 1 tablet (10 mg total) by mouth daily with breakfast. 40 mg x 3 days,20 mg x 3 days, 10 mg x 3 days  Dispense: 21 tablet; Refill: 0

## 2015-06-06 NOTE — Progress Notes (Signed)
Pre visit review using our clinic review tool, if applicable. No additional management support is needed unless otherwise documented below in the visit note. 

## 2015-08-06 ENCOUNTER — Other Ambulatory Visit: Payer: BC Managed Care – PPO

## 2015-08-11 ENCOUNTER — Encounter: Payer: BC Managed Care – PPO | Admitting: Family Medicine

## 2015-09-05 ENCOUNTER — Ambulatory Visit: Payer: BC Managed Care – PPO | Admitting: Adult Health

## 2016-09-22 NOTE — Progress Notes (Signed)
need orders in epic asap for 6-18 surgery

## 2016-09-22 NOTE — Patient Instructions (Addendum)
Rumeal Cullipher D'Englere  09/22/2016   Your procedure is scheduled on: 09-27-16  Report to [facility] Main  Entrance Take [place]  elevators to 3rd floor to  [place] at 920  AM.  Call this number if you have problems the morning of surgery [phone]   Remember: ONLY 1 PERSON MAY GO WITH YOU TO SHORT STAY TO GET  READY MORNING OF [place].  Do not eat food or drink liquids :After Midnight.     Take these medicines the morning of surgery with A SIP OF WATER: none                                You may not have any metal on your body including hair pins and              piercings  Do not wear jewelry, make-up, lotions, powders or perfumes, deodorant             Do not wear nail polish.  Do not shave  48 hours prior to surgery.              Men may shave face and neck.   Do not bring valuables to the hospital. [place].  Contacts, dentures or bridgework may not be worn into surgery.  Leave suitcase in the car. After surgery it may be brought to your room.   Driver if goes home wife Stanton Kidney lou cell [phone]               Please read over the following fact sheets you were given: _____________________________________________________________________             [facility] - Preparing for Surgery Before surgery, you can play an important role.  Because skin is not sterile, your skin needs to be as free of germs as possible.  You can reduce the number of germs on your skin by washing with CHG (chlorahexidine gluconate) soap before surgery.  CHG is an antiseptic cleaner which kills germs and bonds with the skin to continue killing germs even after washing. Please DO NOT use if you have an allergy to CHG or antibacterial soaps.  If your skin becomes reddened/irritated stop using the CHG and inform your nurse when you arrive at Short Stay. Do not shave (including legs and underarms) for at least  48 hours prior to the first CHG shower.  You may shave your face/neck. Please follow these instructions carefully:  1.  Shower with CHG Soap the night before surgery and the  morning of Surgery.  2.  If you choose to wash your hair, wash your hair first as usual with your  normal  shampoo.  3.  After you shampoo, rinse your hair and body thoroughly to remove the  shampoo.                           4.  Use CHG as you would any other liquid soap.  You can apply chg directly  to the skin and wash                       Gently with a  scrungie or clean washcloth.  5.  Apply the CHG Soap to your body ONLY FROM THE NECK DOWN.   Do not use on face/ open                           Wound or open sores. Avoid contact with eyes, ears mouth and genitals (private parts).                       Wash face,  Genitals (private parts) with your normal soap.             6.  Wash thoroughly, paying special attention to the area where your surgery  will be performed.  7.  Thoroughly rinse your body with warm water from the neck down.  8.  DO NOT shower/wash with your normal soap after using and rinsing off  the CHG Soap.                9.  Pat yourself dry with a clean towel.            10.  Wear clean pajamas.            11.  Place clean sheets on your bed the night of your first shower and do not  sleep with pets. Day of Surgery : Do not apply any lotions/deodorants the morning of surgery.  Please wear clean clothes to the hospital/surgery center.  FAILURE TO FOLLOW THESE INSTRUCTIONS MAY RESULT IN THE CANCELLATION OF YOUR SURGERY PATIENT SIGNATURE_________________________________  NURSE SIGNATURE__________________________________  ________________________________________________________________________

## 2016-09-24 ENCOUNTER — Encounter (HOSPITAL_COMMUNITY): Payer: Self-pay

## 2016-09-24 ENCOUNTER — Encounter (HOSPITAL_COMMUNITY)
Admission: RE | Admit: 2016-09-24 | Discharge: 2016-09-24 | Disposition: A | Discharge status: Home / Self Care | Payer: BC Managed Care – PPO | Source: Ambulatory Visit | Attending: Surgery | Admitting: Surgery

## 2016-09-24 ENCOUNTER — Ambulatory Visit: Payer: Self-pay | Admitting: General Surgery

## 2016-09-24 DIAGNOSIS — H9193 Unspecified hearing loss, bilateral: Secondary | ICD-10-CM | POA: Diagnosis not present

## 2016-09-24 DIAGNOSIS — K4090 Unilateral inguinal hernia, without obstruction or gangrene, not specified as recurrent: Secondary | ICD-10-CM | POA: Diagnosis not present

## 2016-09-24 HISTORY — DX: Personal history of urinary calculi: Z87.442

## 2016-09-24 HISTORY — DX: Unilateral inguinal hernia, without obstruction or gangrene, not specified as recurrent: K40.90

## 2016-09-24 LAB — CBC
HCT: 42.6 % (ref 39.0–52.0)
Hemoglobin: 14.3 g/dL (ref 13.0–17.0)
MCH: 30.4 pg (ref 26.0–34.0)
MCHC: 33.6 g/dL (ref 30.0–36.0)
MCV: 90.6 fL (ref 78.0–100.0)
Platelets: 229 10*3/uL (ref 150–400)
RBC: 4.7 MIL/uL (ref 4.22–5.81)
RDW: 13.6 % (ref 11.5–15.5)
WBC: 7.3 10*3/uL (ref 4.0–10.5)

## 2016-09-27 ENCOUNTER — Ambulatory Visit (HOSPITAL_COMMUNITY): Payer: BC Managed Care – PPO | Admitting: Anesthesiology

## 2016-09-27 ENCOUNTER — Encounter (HOSPITAL_COMMUNITY)
Admission: RE | Disposition: A | Discharge status: Home / Self Care | Payer: Self-pay | Source: Ambulatory Visit | Attending: Surgery

## 2016-09-27 ENCOUNTER — Encounter (HOSPITAL_COMMUNITY): Payer: Self-pay | Admitting: *Deleted

## 2016-09-27 ENCOUNTER — Ambulatory Visit (HOSPITAL_COMMUNITY)
Admission: RE | Admit: 2016-09-27 | Discharge: 2016-09-27 | Disposition: A | Discharge status: Home / Self Care | Payer: BC Managed Care – PPO | Source: Ambulatory Visit | Attending: Surgery | Admitting: Surgery

## 2016-09-27 DIAGNOSIS — H9193 Unspecified hearing loss, bilateral: Secondary | ICD-10-CM | POA: Insufficient documentation

## 2016-09-27 DIAGNOSIS — K4090 Unilateral inguinal hernia, without obstruction or gangrene, not specified as recurrent: Secondary | ICD-10-CM | POA: Diagnosis not present

## 2016-09-27 HISTORY — PX: INSERTION OF MESH: SHX5868

## 2016-09-27 HISTORY — PX: INGUINAL HERNIA REPAIR: SHX194

## 2016-09-27 SURGERY — REPAIR, HERNIA, INGUINAL, ADULT
Anesthesia: General | Site: Groin | Laterality: Right

## 2016-09-27 MED ORDER — FENTANYL CITRATE (PF) 100 MCG/2ML IJ SOLN
INTRAMUSCULAR | Status: AC
  Filled 2016-09-27: qty 2

## 2016-09-27 MED ORDER — ONDANSETRON HCL 4 MG/2ML IJ SOLN
INTRAMUSCULAR | Status: DC | PRN
  Administered 2016-09-27: 4 mg via INTRAVENOUS

## 2016-09-27 MED ORDER — SUGAMMADEX SODIUM 200 MG/2ML IV SOLN
INTRAVENOUS | Status: AC
  Filled 2016-09-27: qty 2

## 2016-09-27 MED ORDER — ROCURONIUM BROMIDE 50 MG/5ML IV SOSY
PREFILLED_SYRINGE | INTRAVENOUS | Status: AC
  Filled 2016-09-27: qty 5

## 2016-09-27 MED ORDER — BUPIVACAINE-EPINEPHRINE (PF) 0.5% -1:200000 IJ SOLN
INTRAMUSCULAR | Status: DC | PRN
Start: 2016-09-27 — End: 2016-09-27
  Administered 2016-09-27: 30 mL

## 2016-09-27 MED ORDER — PROMETHAZINE HCL 25 MG/ML IJ SOLN: 6.2500 mg | INTRAMUSCULAR | Status: DC | PRN

## 2016-09-27 MED ORDER — SODIUM CHLORIDE 0.9 % IV SOLN: 250.0000 mL | INTRAVENOUS | Status: DC | PRN

## 2016-09-27 MED ORDER — MIDAZOLAM HCL 2 MG/2ML IJ SOLN
INTRAMUSCULAR | Status: AC
  Filled 2016-09-27: qty 2

## 2016-09-27 MED ORDER — BUPIVACAINE LIPOSOME 1.3 % IJ SUSP
20.0000 mL | Freq: Once | INTRAMUSCULAR | Status: DC
  Filled 2016-09-27: qty 20

## 2016-09-27 MED ORDER — MEPERIDINE HCL 50 MG/ML IJ SOLN: 6.2500 mg | INTRAMUSCULAR | Status: DC | PRN

## 2016-09-27 MED ORDER — HYDROCODONE-ACETAMINOPHEN 5-325 MG PO TABS: 1.0000 | ORAL_TABLET | ORAL | 0 refills | Status: DC | PRN

## 2016-09-27 MED ORDER — PHENYLEPHRINE 40 MCG/ML (10ML) SYRINGE FOR IV PUSH (FOR BLOOD PRESSURE SUPPORT)
PREFILLED_SYRINGE | INTRAVENOUS | Status: DC | PRN
Start: 2016-09-27 — End: 2016-09-27
  Administered 2016-09-27: 80 ug via INTRAVENOUS

## 2016-09-27 MED ORDER — BUPIVACAINE-EPINEPHRINE (PF) 0.25% -1:200000 IJ SOLN
INTRAMUSCULAR | Status: AC
  Filled 2016-09-27: qty 30

## 2016-09-27 MED ORDER — DEXAMETHASONE SODIUM PHOSPHATE 10 MG/ML IJ SOLN
INTRAMUSCULAR | Status: DC | PRN
  Administered 2016-09-27: 10 mg via INTRAVENOUS

## 2016-09-27 MED ORDER — SODIUM CHLORIDE 0.9% FLUSH: 3.0000 mL | Freq: Two times a day (BID) | INTRAVENOUS | Status: DC

## 2016-09-27 MED ORDER — MIDAZOLAM HCL 2 MG/2ML IJ SOLN
2.0000 mg | Freq: Once | INTRAMUSCULAR | Status: AC
  Administered 2016-09-27: 2 mg via INTRAVENOUS

## 2016-09-27 MED ORDER — CHLORHEXIDINE GLUCONATE CLOTH 2 % EX PADS: 6.0000 | MEDICATED_PAD | Freq: Once | CUTANEOUS | Status: DC

## 2016-09-27 MED ORDER — PROPOFOL 10 MG/ML IV BOLUS
INTRAVENOUS | Status: AC
  Filled 2016-09-27: qty 20

## 2016-09-27 MED ORDER — HEPARIN SODIUM (PORCINE) 5000 UNIT/ML IJ SOLN
5000.0000 [IU] | Freq: Once | INTRAMUSCULAR | Status: AC
  Administered 2016-09-27: 5000 [IU] via SUBCUTANEOUS
  Filled 2016-09-27: qty 1

## 2016-09-27 MED ORDER — LACTATED RINGERS IV SOLN
INTRAVENOUS | Status: DC
  Administered 2016-09-27 (×2): via INTRAVENOUS

## 2016-09-27 MED ORDER — SUGAMMADEX SODIUM 200 MG/2ML IV SOLN
INTRAVENOUS | Status: DC | PRN
  Administered 2016-09-27: 200 mg via INTRAVENOUS

## 2016-09-27 MED ORDER — LACTATED RINGERS IV SOLN: INTRAVENOUS | Status: DC

## 2016-09-27 MED ORDER — ONDANSETRON HCL 4 MG/2ML IJ SOLN
INTRAMUSCULAR | Status: AC
  Filled 2016-09-27: qty 2

## 2016-09-27 MED ORDER — 0.9 % SODIUM CHLORIDE (POUR BTL) OPTIME
TOPICAL | Status: DC | PRN
  Administered 2016-09-27: 1000 mL

## 2016-09-27 MED ORDER — SUCCINYLCHOLINE CHLORIDE 200 MG/10ML IV SOSY
PREFILLED_SYRINGE | INTRAVENOUS | Status: DC | PRN
  Administered 2016-09-27: 140 mg via INTRAVENOUS

## 2016-09-27 MED ORDER — DEXAMETHASONE SODIUM PHOSPHATE 10 MG/ML IJ SOLN
INTRAMUSCULAR | Status: AC
  Filled 2016-09-27: qty 1

## 2016-09-27 MED ORDER — SUCCINYLCHOLINE CHLORIDE 200 MG/10ML IV SOSY
PREFILLED_SYRINGE | INTRAVENOUS | Status: AC
  Filled 2016-09-27: qty 10

## 2016-09-27 MED ORDER — FENTANYL CITRATE (PF) 100 MCG/2ML IJ SOLN
INTRAMUSCULAR | Status: DC | PRN
  Administered 2016-09-27: 50 ug via INTRAVENOUS

## 2016-09-27 MED ORDER — HYDROMORPHONE HCL 1 MG/ML IJ SOLN: 0.2500 mg | INTRAMUSCULAR | Status: DC | PRN

## 2016-09-27 MED ORDER — MIDAZOLAM HCL 2 MG/2ML IJ SOLN
INTRAMUSCULAR | Status: AC
  Administered 2016-09-27: 2 mg via INTRAVENOUS
  Filled 2016-09-27: qty 2

## 2016-09-27 MED ORDER — GABAPENTIN 300 MG PO CAPS
300.0000 mg | ORAL_CAPSULE | ORAL | Status: AC
  Administered 2016-09-27: 300 mg via ORAL
  Filled 2016-09-27: qty 1

## 2016-09-27 MED ORDER — SODIUM CHLORIDE 0.9% FLUSH: 3.0000 mL | INTRAVENOUS | Status: DC | PRN

## 2016-09-27 MED ORDER — BUPIVACAINE LIPOSOME 1.3 % IJ SUSP
INTRAMUSCULAR | Status: DC | PRN
  Administered 2016-09-27: 20 mL

## 2016-09-27 MED ORDER — ACETAMINOPHEN 325 MG PO TABS: 650.0000 mg | ORAL_TABLET | ORAL | Status: DC | PRN

## 2016-09-27 MED ORDER — FENTANYL CITRATE (PF) 100 MCG/2ML IJ SOLN
100.0000 ug | Freq: Once | INTRAMUSCULAR | Status: AC
  Administered 2016-09-27: 100 ug via INTRAVENOUS

## 2016-09-27 MED ORDER — CEFAZOLIN SODIUM-DEXTROSE 2-4 GM/100ML-% IV SOLN
2.0000 g | INTRAVENOUS | Status: AC
  Administered 2016-09-27: 2 g via INTRAVENOUS
  Filled 2016-09-27: qty 100

## 2016-09-27 MED ORDER — LIDOCAINE 2% (20 MG/ML) 5 ML SYRINGE
INTRAMUSCULAR | Status: DC | PRN
  Administered 2016-09-27: 100 mg via INTRAVENOUS

## 2016-09-27 MED ORDER — ROCURONIUM BROMIDE 10 MG/ML (PF) SYRINGE
PREFILLED_SYRINGE | INTRAVENOUS | Status: DC | PRN
  Administered 2016-09-27: 10 mg via INTRAVENOUS
  Administered 2016-09-27: 40 mg via INTRAVENOUS

## 2016-09-27 MED ORDER — LIDOCAINE 2% (20 MG/ML) 5 ML SYRINGE
INTRAMUSCULAR | Status: AC
Start: 2016-09-27 — End: ?
  Filled 2016-09-27: qty 5

## 2016-09-27 MED ORDER — OXYCODONE HCL 5 MG PO TABS: 5.0000 mg | ORAL_TABLET | ORAL | Status: DC | PRN

## 2016-09-27 MED ORDER — FENTANYL CITRATE (PF) 100 MCG/2ML IJ SOLN
INTRAMUSCULAR | Status: AC
  Administered 2016-09-27: 100 ug via INTRAVENOUS
  Filled 2016-09-27: qty 2

## 2016-09-27 MED ORDER — ACETAMINOPHEN 500 MG PO TABS
1000.0000 mg | ORAL_TABLET | ORAL | Status: AC
  Administered 2016-09-27: 1000 mg via ORAL
  Filled 2016-09-27: qty 2

## 2016-09-27 MED ORDER — CELECOXIB 200 MG PO CAPS
400.0000 mg | ORAL_CAPSULE | ORAL | Status: AC
  Administered 2016-09-27: 400 mg via ORAL
  Filled 2016-09-27: qty 2

## 2016-09-27 MED ORDER — PROPOFOL 10 MG/ML IV BOLUS
INTRAVENOUS | Status: DC | PRN
  Administered 2016-09-27: 200 mg via INTRAVENOUS

## 2016-09-27 MED ORDER — ACETAMINOPHEN 650 MG RE SUPP
650.0000 mg | RECTAL | Status: DC | PRN
  Filled 2016-09-27: qty 1

## 2016-09-27 MED FILL — HYDROCODON-APAP 5-325: 5-325 | 5 days supply | Qty: 30 | Fill #0

## 2016-09-27 SURGICAL SUPPLY — 31 items
BLADE SURG 15 STRL LF DISP TIS (BLADE) ×1 IMPLANT
BLADE SURG 15 STRL SS (BLADE) ×1
COVER SURGICAL LIGHT HANDLE (MISCELLANEOUS) ×2 IMPLANT
DECANTER SPIKE VIAL GLASS SM (MISCELLANEOUS) ×2 IMPLANT
DERMABOND ADVANCED (GAUZE/BANDAGES/DRESSINGS) ×1
DERMABOND ADVANCED .7 DNX12 (GAUZE/BANDAGES/DRESSINGS) ×1 IMPLANT
DISSECTOR ROUND CHERRY 3/8 STR (MISCELLANEOUS) ×2 IMPLANT
DRAIN PENROSE 18X1/2 LTX STRL (DRAIN) ×2 IMPLANT
DRAPE LAPAROTOMY TRNSV 102X78 (DRAPE) ×2 IMPLANT
ELECT PENCIL ROCKER SW 15FT (MISCELLANEOUS) ×2 IMPLANT
ELECT REM PT RETURN 15FT ADLT (MISCELLANEOUS) ×2 IMPLANT
GLOVE BIOGEL M 8.0 STRL (GLOVE) ×2 IMPLANT
GOWN STRL REUS W/TWL XL LVL3 (GOWN DISPOSABLE) ×4 IMPLANT
KIT BASIN OR (CUSTOM PROCEDURE TRAY) ×2 IMPLANT
MESH HERNIA 3X6 (Mesh General) ×2 IMPLANT
NEEDLE HYPO 22GX1.5 SAFETY (NEEDLE) ×2 IMPLANT
PACK BASIC VI WITH GOWN DISP (CUSTOM PROCEDURE TRAY) ×2 IMPLANT
SPONGE LAP 4X18 X RAY DECT (DISPOSABLE) ×2 IMPLANT
STAPLER VISISTAT 35W (STAPLE) IMPLANT
SUT MON AB 5-0 PS2 18 (SUTURE) ×4 IMPLANT
SUT PROLENE 2 0 CT2 30 (SUTURE) ×4 IMPLANT
SUT SILK 2 0 SH (SUTURE) IMPLANT
SUT VIC AB 2-0 SH 27 (SUTURE) ×2
SUT VIC AB 2-0 SH 27X BRD (SUTURE) ×2 IMPLANT
SUT VIC AB 4-0 SH 18 (SUTURE) ×2 IMPLANT
SUT VICRYL 4-0 (SUTURE) ×2 IMPLANT
SYR 20CC LL (SYRINGE) ×2 IMPLANT
SYR BULB IRRIGATION 50ML (SYRINGE) ×2 IMPLANT
TOWEL OR 17X26 10 PK STRL BLUE (TOWEL DISPOSABLE) ×2 IMPLANT
TOWEL OR NON WOVEN STRL DISP B (DISPOSABLE) ×2 IMPLANT
YANKAUER SUCT BULB TIP 10FT TU (MISCELLANEOUS) ×2 IMPLANT

## 2016-09-27 NOTE — Discharge Instructions (Signed)
Hernia A hernia happens when an organ or tissue inside your body pushes out through a weak spot in the belly (abdomen). Follow these instructions at home:  Avoid stretching or overusing (straining) the muscles near the hernia.  Do not lift anything heavier than 10 lb (4.5 kg).  Use the muscles in your leg when you lift something up. Do not use the muscles in your back.  When you cough, try to cough gently.  Eat a diet that has a lot of fiber. Eat lots of fruits and vegetables.  Drink enough fluids to keep your pee (urine) clear or pale yellow. Try to drink 6-8 glasses of water a day.  Take medicines to make your poop soft (stool softeners) as told by your doctor.  Lose weight, if you are overweight.  Do not use any tobacco products, including cigarettes, chewing tobacco, or electronic cigarettes. If you need help quitting, ask your doctor.  Keep all follow-up visits as told by your doctor. This is important. Contact a doctor if:  The skin by the hernia gets puffy (swollen) or red.  The hernia is painful. Get help right away if:  You have a fever.  You have belly pain that is getting worse.  You feel sick to your stomach (nauseous) or you throw up (vomit).  You cannot push the hernia back in place by gently pressing on it while you are lying down.  The hernia: ? Changes in shape or size. ? Is stuck outside your belly. ? Changes color. ? Feels hard or tender. This information is not intended to replace advice given to you by your health care provider. Make sure you discuss any questions you have with your health care provider. Document Released: 09/16/2009 Document Revised: 09/04/2015 Document Reviewed: 02/06/2014 Elsevier Interactive Patient Education  2018 [place] Anesthesia, Adult, Care After These instructions provide you with information about caring for yourself after your procedure. Your health care provider may also give you more specific  instructions. Your treatment has been planned according to current medical practices, but problems sometimes occur. Call your health care provider if you have any problems or questions after your procedure. What can I expect after the procedure? After the procedure, it is common to have:  Vomiting.  A sore throat.  Mental slowness.  It is common to feel:  Nauseous.  Cold or shivery.  Sleepy.  Tired.  Sore or achy, even in parts of your body where you did not have surgery.  Follow these instructions at home: For at least 24 hours after the procedure:  Do not: ? Participate in activities where you could fall or become injured. ? Drive. ? Use heavy machinery. ? Drink alcohol. ? Take sleeping pills or medicines that cause drowsiness. ? Make important decisions or sign legal documents. ? Take care of children on your own.  Rest. Eating and drinking  If you vomit, drink water, juice, or soup when you can drink without vomiting.  Drink enough fluid to keep your urine clear or pale yellow.  Make sure you have little or no nausea before eating solid foods.  Follow the diet recommended by your health care provider. General instructions  Have a responsible adult stay with you until you are awake and alert.  Return to your normal activities as told by your health care provider. Ask your health care provider what activities are safe for you.  Take over-the-counter and prescription medicines only as told by your health care provider.  If  you smoke, do not smoke without supervision.  Keep all follow-up visits as told by your health care provider. This is important. Contact a health care provider if:  You continue to have nausea or vomiting at home, and medicines are not helpful.  You cannot drink fluids or start eating again.  You cannot urinate after 8-12 hours.  You develop a skin rash.  You have fever.  You have increasing redness at the site of your  procedure. Get help right away if:  You have difficulty breathing.  You have chest pain.  You have unexpected bleeding.  You feel that you are having a life-threatening or urgent problem. This information is not intended to replace advice given to you by your health care provider. Make sure you discuss any questions you have with your health care provider. Document Released: 07/05/2000 Document Revised: 09/01/2015 Document Reviewed: 03/13/2015 Elsevier Interactive Patient Education  [employer].

## 2016-09-27 NOTE — Op Note (Signed)
Surgeon: Kaylyn Lim, MD, FACS  Asst:  Marcie Bal, PA student  Anes:  general  Preop Dx: Right inguinal hernia Postop Dx: Right direct inguinal hernia  Procedure: Open right inguinal hernia repair with Marlex type mesh Location Surgery: [facility] 1 Complications: none  EBL:   minimal cc  Drains: none  Description of Procedure:  The patient was taken to OR 1 .  After anesthesia was administered and the patient was prepped a timeout was performed.  He had a preop TAPP block.  Right side had been marked and a small oblique incision was made and carried down to the external oblique. These were incised along the fibers and opened down to the external ring.  Cord was mobilized and the proximal cord was examined and no indirect hernia was seen. The floor was compromised medial to the epigastric vessel and this was a prominent direct inguinal hernia.    The bulge was incised and the transveralis fascia was imbricated to decrease the direct defect.  The floor was reinforced with a piece of Marlex type mesh sutured to the inguinal ligament and medially to the internal oblique with 2-0 prolene.  The mesh was taken around the cord and tacked to itself with a horizontal mattress of prolene 2-0.  The wound was irrigated and closed in layers with 4-0 vicryl and 4-0 and Dermabond.    The patient tolerated the procedure well and was taken to the PACU in stable condition.     Matt B. Hassell Done, [place], [facility], [place]

## 2016-09-27 NOTE — Anesthesia Preprocedure Evaluation (Addendum)
Anesthesia Evaluation  Patient identified by MRN, date of birth, ID band Patient awake    Reviewed: Allergy & Precautions, NPO status , Patient's Chart, lab work & pertinent test results  Airway Mallampati: II  TM Distance: >3 FB Neck ROM: Full    Dental  (+) Teeth Intact, Dental Advisory Given   Pulmonary neg pulmonary ROS,    breath sounds clear to auscultation       Cardiovascular negative cardio ROS   Rhythm:Regular Rate:Normal     Neuro/Psych negative neurological ROS  negative psych ROS   GI/Hepatic negative GI ROS, Neg liver ROS,   Endo/Other  negative endocrine ROS  Renal/GU negative Renal ROS  negative genitourinary   Musculoskeletal negative musculoskeletal ROS (+)   Abdominal   Peds negative pediatric ROS (+)  Hematology negative hematology ROS (+)   Anesthesia Other Findings   Reproductive/Obstetrics negative OB ROS                            Anesthesia Physical Anesthesia Plan  ASA: II  Anesthesia Plan: General   Post-op Pain Management:  Regional for Post-op pain   Induction: Intravenous  PONV Risk Score and Plan: 3 and Ondansetron, Dexamethasone, Propofol and Midazolam  Airway Management Planned: Oral ETT  Additional Equipment:   Intra-op Plan:   Post-operative Plan: Extubation in OR  Informed Consent: I have reviewed the patients History and Physical, chart, labs and discussed the procedure including the risks, benefits and alternatives for the proposed anesthesia with the patient or authorized representative who has indicated his/her understanding and acceptance.     Plan Discussed with: CRNA  Anesthesia Plan Comments:         Anesthesia Quick Evaluation

## 2016-09-27 NOTE — Anesthesia Procedure Notes (Signed)
Anesthesia Regional Block: TAP block   Pre-Anesthetic Checklist: ,, timeout performed, Correct Patient, Correct Site, Correct Laterality, Correct Procedure, Correct Position, site marked, Risks and benefits discussed,  Surgical consent,  Pre-op evaluation,  At surgeon's request and post-op pain management  Laterality: Right  Prep: chloraprep       Needles:  Injection technique: Single-shot  Needle Type: Echogenic Needle     Needle Length: 9cm  Needle Gauge: 21     Additional Needles:   Procedures: ultrasound guided,,,,,,,,  Narrative:  Start time: 09/27/2016 11:25 AM End time: 09/27/2016 11:30 AM Injection made incrementally with aspirations every 5 mL.  Performed by: Personally  Anesthesiologist: Suella Broad D  Additional Notes: Tolerated well.

## 2016-09-27 NOTE — Anesthesia Procedure Notes (Signed)
Procedure Name: Intubation Date/Time: 09/27/2016 12:08 PM Performed by: Lind Covert Pre-anesthesia Checklist: Patient identified Patient Re-evaluated:Patient Re-evaluated prior to inductionOxygen Delivery Method: Circle system utilized Preoxygenation: Pre-oxygenation with 100% oxygen Intubation Type: IV induction Ventilation: Mask ventilation without difficulty Laryngoscope Size: 4 and Mac Grade View: Grade I Tube type: Oral Tube size: 7.5 mm Number of attempts: 1 Airway Equipment and Method: Stylet Placement Confirmation: ETT inserted through vocal cords under direct vision,  positive ETCO2 and breath sounds checked- equal and bilateral Secured at: 23 cm Tube secured with: Tape Dental Injury: Teeth and Oropharynx as per pre-operative assessment

## 2016-09-27 NOTE — H&P (Signed)
Chief Complaint:  New right inguinal hernia  History of Present Illness:  Scott Schultz is an 61 y.o. male well known to me who presented with a new right inguinal hernia.  This bothers him in his daily living and his swimming.  Informed consent obtained about repair of RIH.    Past Medical History:  Diagnosis Date  . Hearing loss    both ears  . History of kidney stones   . Right inguinal hernia   . Sinusitis     Past Surgical History:  Procedure Laterality Date  . colonscopy      Current Facility-Administered Medications  Medication Dose Route Frequency Provider Last Rate Last Dose  . ceFAZolin (ANCEF) IVPB 2g/100 mL premix  2 g Intravenous On Call to OR Johnathan Hausen, MD      . Chlorhexidine Gluconate Cloth 2 % PADS 6 each  6 each Topical Once Johnathan Hausen, MD      . lactated ringers infusion   Intravenous Continuous Ellender, Karyl Kinnier, MD 75 mL/hr at 09/27/16 0935     Facility-Administered Medications Ordered in Other Encounters  Medication Dose Route Frequency Provider Last Rate Last Dose  . bupivacaine-epinephrine (MARCAINE W/ EPI) 0.5% -1:200000 injection    Anesthesia Intra-op Effie Berkshire, MD   30 mL at 09/27/16 1129   Patient has no known allergies. Family History  Problem Relation Age of Onset  . Healthy Mother   . Healthy Father   . Healthy Brother   . Healthy Brother   . Healthy Brother   . Healthy Brother    Social History:   reports that he has never smoked. He has never used smokeless tobacco. He reports that he drinks alcohol. He reports that he does not use drugs.   REVIEW OF SYSTEMS : Negative except for see problem list  Physical Exam:   Blood pressure 136/71, pulse 71, temperature 98.7 F (37.1 C), temperature source Oral, resp. rate 18, height 6\' 3"  (1.905 m), weight 84.8 kg (186 lb 14.4 oz), SpO2 100 %. Body mass index is 23.36 kg/m.  Gen:  WDWN WM NAD  Neurological: Alert and oriented to person, place, and time. Motor and sensory  function is grossly intact  Head: Normocephalic and atraumatic.  Eyes: Conjunctivae are normal. Pupils are equal, round, and reactive to light. No scleral icterus.  Neck: Normal range of motion. Neck supple. No tracheal deviation or thyromegaly present.  Cardiovascular:  SR without murmurs or gallops.  No carotid bruits Breast:  Not examined Respiratory: Effort normal.  No respiratory distress. No chest wall tenderness. Breath sounds normal.  No wheezes, rales or rhonchi.  Abdomen:  nontender and thin GU:  Right inguinal hernia Musculoskeletal: Normal range of motion. Extremities are nontender. No cyanosis, edema or clubbing noted Lymphadenopathy: No cervical, preauricular, postauricular or axillary adenopathy is present Skin: Skin is warm and dry. No rash noted. No diaphoresis. No erythema. No pallor. Pscyh: Normal mood and affect. Behavior is normal. Judgment and thought content normal.   LABORATORY RESULTS: No results found for this or any previous visit (from the past 48 hour(s)).   RADIOLOGY RESULTS: No results found.  Problem List: Patient Active Problem List   Diagnosis Date Noted  . Urinary frequency 04/01/2014  . Influenza with respiratory manifestations 03/18/2014  . Routine general medical examination at a health care facility 04/16/2013  . Allergic rhinitis 08/10/2012  . Reactive airway disease with wheezing 08/10/2012  . Contact dermatitis and other eczema due to plants (except  food) 08/01/2012  . Hearing loss 03/30/2012  . Allergic reaction 01/28/2011    Assessment & Plan: Right inguinal hernia for repair.      Matt B. Hassell Done, MD, [facility] [phone] beeper [phone]  09/27/2016 11:52 AM

## 2016-09-27 NOTE — Transfer of Care (Signed)
Immediate Anesthesia Transfer of Care Note  Patient: Scott Schultz  Procedure(s) Performed: Procedure(s): OPEN RIGHT INGUINAL HERNIA REPAIR WITH MESH (Right) INSERTION OF MESH (Right)  Patient Location: PACU  Anesthesia Type:General  Level of Consciousness: sedated  Airway & Oxygen Therapy: Patient Spontanous Breathing and Patient connected to face mask oxygen  Post-op Assessment: Report given to RN and Post -op Vital signs reviewed and stable  Post vital signs: Reviewed and stable  Last Vitals:  Vitals:   09/27/16 1133 09/27/16 1134  BP:  136/71  Pulse: 68 71  Resp:    Temp:      Last Pain:  Vitals:   09/27/16 0904  TempSrc: Oral      Patients Stated Pain Goal: 4 (12/45/80 9983)  Complications: No apparent anesthesia complications

## 2016-09-27 NOTE — Progress Notes (Signed)
Assisted Dr. Hollis with right, ultrasound guided, transabdominal plane block. Side rails up, monitors on throughout procedure. See vital signs in flow sheet. Tolerated Procedure well.  

## 2016-09-27 NOTE — Anesthesia Postprocedure Evaluation (Signed)
Anesthesia Post Note  Patient: Maicol A D'Englere  Procedure(s) Performed: Procedure(s) (LRB): OPEN RIGHT INGUINAL HERNIA REPAIR WITH MESH (Right) INSERTION OF MESH (Right)     Patient location during evaluation: PACU Anesthesia Type: General and Regional Level of consciousness: awake and alert Pain management: pain level controlled Vital Signs Assessment: post-procedure vital signs reviewed and stable Respiratory status: spontaneous breathing, nonlabored ventilation, respiratory function stable and patient connected to nasal cannula oxygen Cardiovascular status: blood pressure returned to baseline and stable Postop Assessment: no signs of nausea or vomiting Anesthetic complications: no    Last Vitals:  Vitals:   09/27/16 1345 09/27/16 1400  BP: 129/76 121/77  Pulse: 68 62  Resp: 13 12  Temp:      Last Pain:  Vitals:   09/27/16 1400  TempSrc:   PainSc: 2                  Effie Berkshire

## 2016-09-28 ENCOUNTER — Encounter (HOSPITAL_COMMUNITY): Payer: Self-pay | Admitting: Surgery

## 2017-09-02 ENCOUNTER — Ambulatory Visit (INDEPENDENT_AMBULATORY_CARE_PROVIDER_SITE_OTHER): Payer: Managed Care, Other (non HMO) | Admitting: Adult Health

## 2017-09-02 ENCOUNTER — Encounter: Payer: Self-pay | Admitting: Adult Health

## 2017-09-02 VITALS — BP 106/66 | Temp 97.8°F | Wt 189.0 lb

## 2017-09-02 DIAGNOSIS — L723 Sebaceous cyst: Secondary | ICD-10-CM

## 2017-09-02 MED ORDER — CEPHALEXIN 500 MG PO CAPS: 500.0000 mg | ORAL_CAPSULE | Freq: Two times a day (BID) | ORAL | 0 refills | Status: AC

## 2017-09-02 NOTE — Progress Notes (Signed)
Subjective:    Patient ID: Scott Schultz, male    DOB: Mar 10, 1956, 62 y.o.   MRN: [id number]  HPI 62 year old male who  has a past medical history of Hearing loss, History of kidney stones, Right inguinal hernia, and Sinusitis.  He presents to the office today for removal of sebaceous cyst on right side of scalp, above ear. Reports tenderness at time. Can be irritated by wearing a hat. Denies any drainage, fevers, or feeling ill.    Review of Systems See HPI   Past Medical History:  Diagnosis Date  . Hearing loss    both ears  . History of kidney stones   . Right inguinal hernia   . Sinusitis     Social History   Socioeconomic History  . Marital status: Married    Spouse name: Not on file  . Number of children: Not on file  . Years of education: Not on file  . Highest education level: Not on file  Occupational History  . Not on file  Social Needs  . Financial resource strain: Not on file  . Food insecurity:    Worry: Not on file    Inability: Not on file  . Transportation needs:    Medical: Not on file    Non-medical: Not on file  Tobacco Use  . Smoking status: Never Smoker  . Smokeless tobacco: Never Used  Substance and Sexual Activity  . Alcohol use: Yes    Alcohol/week: 0.0 oz    Comment: occ  . Drug use: No  . Sexual activity: Not on file  Lifestyle  . Physical activity:    Days per week: Not on file    Minutes per session: Not on file  . Stress: Not on file  Relationships  . Social connections:    Talks on phone: Not on file    Gets together: Not on file    Attends religious service: Not on file    Active member of club or organization: Not on file    Attends meetings of clubs or organizations: Not on file    Relationship status: Not on file  . Intimate partner violence:    Fear of current or ex partner: Not on file    Emotionally abused: Not on file    Physically abused: Not on file    Forced sexual activity: Not on file  Other Topics  Concern  . Not on file  Social History Narrative   Occupation:   Married   Never Smoked   Alcohol use- yes   Regular exercise- yes          Past Surgical History:  Procedure Laterality Date  . colonscopy    . INGUINAL HERNIA REPAIR Right 09/27/2016   Procedure: OPEN RIGHT INGUINAL HERNIA REPAIR WITH MESH;  Surgeon: Johnathan Hausen, MD;  Location: [facility];  Service: General;  Laterality: Right;  . INSERTION OF MESH Right 09/27/2016   Procedure: INSERTION OF MESH;  Surgeon: Johnathan Hausen, MD;  Location: [facility];  Service: General;  Laterality: Right;    Family History  Problem Relation Age of Onset  . Healthy Mother   . Healthy Father   . Healthy Brother   . Healthy Brother   . Healthy Brother   . Healthy Brother     No Known Allergies  No current outpatient medications on file prior to visit.   No current facility-administered medications on file prior to visit.     BP 106/66  Temp 97.8 F (36.6 C) (Oral)   Wt 189 lb (85.7 kg)   BMI 23.62 kg/m       Objective:   Physical Exam  Constitutional: He is oriented to person, place, and time. He appears well-developed and well-nourished. No distress.  Cardiovascular: Normal rate, regular rhythm, normal heart sounds and intact distal pulses. Exam reveals no gallop and no friction rub.  No murmur heard. Pulmonary/Chest: Effort normal and breath sounds normal. He exhibits no tenderness.  Neurological: He is alert and oriented to person, place, and time.  Skin: Skin is warm and dry. He is not diaphoretic.  2.0cm sebaceous cyst noted on right side of scalp. No redness warmth or tenderness  Psychiatric: He has a normal mood and affect. His behavior is normal. Judgment and thought content normal.  Vitals reviewed.     Assessment & Plan:  1. Sebaceous cyst Procedure:  Incision and drainage of cyst of scalp  Risks, benefits, and alternatives explained and consent obtained. Time out conducted. Surface cleaned with  alcohol.  1.5 cc lidocaine with epinephine infiltrated around cyst Adequate anesthesia ensured. Area prepped and draped in a sterile fashion. #11 blade used to make a slice incision into cyst Material expressed with pressure. Curved hemostat used to explore 4 quadrants and loculations broken up. Further purulence expressed. Sac was removed in entirety  Hemostasis achieved.Wound was closed with surgi glue Pt stable. Aftercare and follow-up advised - Patient tolerated procedure well   - cephALEXin (KEFLEX) 500 MG capsule; Take 1 capsule (500 mg total) by mouth 2 (two) times daily for 7 days.  Dispense: 14 capsule; Refill: 0  Dorothyann Peng, NP

## 2017-09-02 NOTE — Patient Instructions (Signed)
It was great seeing you again   Please be careful when coming your hair today - due to the glue  Stay out of water for 3-5 days   I have sent in a prescription for Keflex - antibiotic

## 2018-06-10 ENCOUNTER — Encounter: Payer: Self-pay | Admitting: Gastroenterology

## 2018-06-12 ENCOUNTER — Encounter: Payer: Self-pay | Admitting: Gastroenterology

## 2018-06-16 ENCOUNTER — Encounter: Payer: Self-pay | Admitting: Gastroenterology

## 2018-06-16 ENCOUNTER — Ambulatory Visit (AMBULATORY_SURGERY_CENTER): Payer: Self-pay | Admitting: *Deleted

## 2018-06-16 VITALS — Ht 75.0 in | Wt 186.0 lb

## 2018-06-16 DIAGNOSIS — Z1211 Encounter for screening for malignant neoplasm of colon: Secondary | ICD-10-CM

## 2018-06-16 MED ORDER — NA SULFATE-K SULFATE-MG SULF 17.5-3.13-1.6 GM/177ML PO SOLN: ORAL | 0 refills | Status: DC

## 2018-06-16 NOTE — Progress Notes (Signed)
Patient denies any allergies to eggs or soy. Patient denies any problems with anesthesia/sedation. Patient denies any oxygen use at home. Patient denies taking any diet/weight loss medications or blood thinners. suprep coupon given.

## 2018-06-28 ENCOUNTER — Telehealth: Payer: Self-pay

## 2018-06-28 NOTE — Telephone Encounter (Signed)
Covid-19 travel screening questions  Have you traveled in the last 14 days? No If yes where?  Do you now or have you had a fever in the last 14 days? No Do you have any respiratory symptoms of shortness of breath or cough now or in the last 14 days? No Do you have a medical history of Congestive Heart Failure?  Do you have a medical history of lung disease?  Do you have any family members or close contacts with diagnosed or suspected Covid-19? No      

## 2018-06-29 ENCOUNTER — Encounter: Payer: Self-pay | Admitting: Gastroenterology

## 2018-06-29 ENCOUNTER — Ambulatory Visit (AMBULATORY_SURGERY_CENTER): Payer: Managed Care, Other (non HMO) | Admitting: Gastroenterology

## 2018-06-29 ENCOUNTER — Other Ambulatory Visit: Payer: Self-pay

## 2018-06-29 VITALS — BP 96/58 | HR 56 | Temp 96.9°F | Resp 16 | Ht 75.0 in | Wt 186.0 lb

## 2018-06-29 DIAGNOSIS — Z1211 Encounter for screening for malignant neoplasm of colon: Secondary | ICD-10-CM

## 2018-06-29 DIAGNOSIS — D123 Benign neoplasm of transverse colon: Secondary | ICD-10-CM

## 2018-06-29 MED ORDER — SODIUM CHLORIDE 0.9 % IV SOLN: 500.0000 mL | Freq: Once | INTRAVENOUS | Status: DC

## 2018-06-29 NOTE — Patient Instructions (Signed)
Information on polyps, diverticulosis and hemorrhoids given to you today.  Await pathology results.  YOU HAD AN ENDOSCOPIC PROCEDURE TODAY AT [facility]:   Refer to the procedure report that was given to you for any specific questions about what was found during the examination.  If the procedure report does not answer your questions, please call your gastroenterologist to clarify.  If you requested that your care partner not be given the details of your procedure findings, then the procedure report has been included in a sealed envelope for you to review at your convenience later.  YOU SHOULD EXPECT: Some feelings of bloating in the abdomen. Passage of more gas than usual.  Walking can help get rid of the air that was put into your GI tract during the procedure and reduce the bloating. If you had a lower endoscopy (such as a colonoscopy or flexible sigmoidoscopy) you may notice spotting of blood in your stool or on the toilet paper. If you underwent a bowel prep for your procedure, you may not have a normal bowel movement for a few days.  Please Note:  You might notice some irritation and congestion in your nose or some drainage.  This is from the oxygen used during your procedure.  There is no need for concern and it should clear up in a day or so.  SYMPTOMS TO REPORT IMMEDIATELY:   Following lower endoscopy (colonoscopy or flexible sigmoidoscopy):  Excessive amounts of blood in the stool  Significant tenderness or worsening of abdominal pains  Swelling of the abdomen that is new, acute  Fever of 100F or higher  For urgent or emergent issues, a gastroenterologist can be reached at any hour by calling [phone].   DIET:  We do recommend a small meal at first, but then you may proceed to your regular diet.  Drink plenty of fluids but you should avoid alcoholic beverages for 24 hours.  ACTIVITY:  You should plan to take it easy for the rest of today and you should NOT  DRIVE or use heavy machinery until tomorrow (because of the sedation medicines used during the test).    FOLLOW UP: Our staff will call the number listed on your records the next business day following your procedure to check on you and address any questions or concerns that you may have regarding the information given to you following your procedure. If we do not reach you, we will leave a message.  However, if you are feeling well and you are not experiencing any problems, there is no need to return our call.  We will assume that you have returned to your regular daily activities without incident.  If any biopsies were taken you will be contacted by phone or by letter within the next 1-3 weeks.  Please call us at [phone] if you have not heard about the biopsies in 3 weeks.    SIGNATURES/CONFIDENTIALITY: You and/or your care partner have signed paperwork which will be entered into your electronic medical record.  These signatures attest to the fact that that the information above on your After Visit Summary has been reviewed and is understood.  Full responsibility of the confidentiality of this discharge information lies with you and/or your care-partner.

## 2018-06-29 NOTE — Op Note (Signed)
[facility] Patient Name: Scott Schultz Procedure Date: 06/29/2018 1:40 PM MRN: [id number] Endoscopist: Bur[place]on. Loletha Carrow , MD Age: 63 Referring MD:  Date of Birth: 1955-10-22 Gender: Male Account #: [id number] Procedure:                Colonoscopy Indications:              Screening for colorectal malignant neoplasm (no                            polyps on 04/2008 colonoscopy) Medicines:                Monitored Anesthesia Care Procedure:                Pre-Anesthesia Assessment:                           - Prior to the procedure, a History and Physical                            was performed, and patient medications and                            allergies were reviewed. The patient's tolerance of                            previous anesthesia was also reviewed. The risks                            and benefits of the procedure and the sedation                            options and risks were discussed with the patient.                            All questions were answered, and informed consent                            was obtained. Prior Anticoagulants: The patient has                            taken no previous anticoagulant or antiplatelet                            agents. ASA Grade Assessment: II - A patient with                            mild systemic disease. After reviewing the risks                            and benefits, the patient was deemed in                            satisfactory condition to undergo the procedure.  After obtaining informed consent, the colonoscope                            was passed under direct vision. Throughout the                            procedure, the patient's blood pressure, pulse, and                            oxygen saturations were monitored continuously. The                            Colonoscope was introduced through the anus and                            advanced to the the cecum,  identified by                            appendiceal orifice and ileocecal valve. The                            colonoscopy was performed without difficulty. The                            patient tolerated the procedure well. The quality                            of the bowel preparation was good. The ileocecal                            valve, appendiceal orifice, and rectum were                            photographed. Scope In: 1:47:11 PM Scope Out: 2:05:40 PM Scope Withdrawal Time: 0 hours 10 minutes 14 seconds  Total Procedure Duration: 0 hours 18 minutes 29 seconds  Findings:                 The perianal and digital rectal examinations were                            normal.                           Four sessile polyps were found in the proximal                            transverse colon and hepatic flexure. The polyps                            were diminutive in size. These polyps were removed                            with a cold snare. Resection and retrieval were  complete.                           Multiple diverticula were found in the left colon.                           The exam was otherwise without abnormality on                            direct and retroflexion views. Complications:            No immediate complications. Estimated Blood Loss:     Estimated blood loss was minimal. Impression:               - Four diminutive polyps in the proximal transverse                            colon and at the hepatic flexure, removed with a                            cold snare. Resected and retrieved.                           - Diverticulosis in the left colon.                           - The examination was otherwise normal on direct                            and retroflexion views. Recommendation:           - Patient has a contact number available for                            emergencies. The signs and symptoms of potential                             delayed complications were discussed with the                            patient. Return to normal activities tomorrow.                            Written discharge instructions were provided to the                            patient.                           - Resume previous diet.                           - Continue present medications.                           - Await pathology results.                           -  Repeat colonoscopy is recommended for                            surveillance. The colonoscopy date will be                            determined after pathology results from today's                            exam become available for review. Caitlin Hillmer L. Loletha Carrow, MD 06/29/2018 2:13:40 PM This report has been signed electronically.

## 2018-06-29 NOTE — Progress Notes (Signed)
Called to room to assist during endoscopic procedure.  Patient ID and intended procedure confirmed with present staff. Received instructions for my participation in the procedure from the performing physician.  

## 2018-06-29 NOTE — Progress Notes (Signed)
Pt's states no medical or surgical changes since previsit or office visit. 

## 2018-06-29 NOTE — Progress Notes (Signed)
PT taken to PACU. Monitors in place. VSS. Report given to RN. 

## 2018-06-30 ENCOUNTER — Telehealth: Payer: Self-pay | Admitting: *Deleted

## 2018-06-30 NOTE — Telephone Encounter (Signed)
  Follow up Call-  Call back number 06/29/2018  Post procedure Call Back phone  # [phone]  Permission to leave phone message Yes  Some recent data might be hidden     Patient questions:  Do you have a fever, pain , or abdominal swelling? No. Pain Score  0 *  Have you tolerated food without any problems? Yes.    Have you been able to return to your normal activities? Yes.    Do you have any questions about your discharge instructions: Diet   No. Medications  No. Follow up visit  No.  Do you have questions or concerns about your Care? No.  Actions: * If pain score is 4 or above: No action needed, pain <4.

## 2018-07-04 ENCOUNTER — Encounter: Payer: Self-pay | Admitting: Gastroenterology

## 2018-08-21 ENCOUNTER — Other Ambulatory Visit: Payer: Self-pay

## 2018-08-22 ENCOUNTER — Encounter: Payer: Self-pay | Admitting: Adult Health

## 2018-08-22 ENCOUNTER — Other Ambulatory Visit: Payer: Self-pay

## 2018-08-22 ENCOUNTER — Ambulatory Visit (INDEPENDENT_AMBULATORY_CARE_PROVIDER_SITE_OTHER): Payer: Managed Care, Other (non HMO) | Admitting: Adult Health

## 2018-08-22 VITALS — BP 106/72 | Temp 98.0°F | Wt 192.0 lb

## 2018-08-22 DIAGNOSIS — L723 Sebaceous cyst: Secondary | ICD-10-CM | POA: Diagnosis not present

## 2018-08-22 MED ORDER — CEPHALEXIN 500 MG PO CAPS: 500.0000 mg | ORAL_CAPSULE | Freq: Three times a day (TID) | ORAL | 0 refills | Status: AC

## 2018-08-22 NOTE — Progress Notes (Signed)
Subjective:    Patient ID: Scott Schultz, male    DOB: May 03, 1955, 63 y.o.   MRN: [id number]  HPI 63 year old male who  has a past medical history of Hearing loss, History of kidney stones, Right inguinal hernia, and Sinusitis.  Presents to the office today for removal of sebaceous cyst on right side of scalp above ear.  Had 1 removed approximately this time last year to roughly the same area, this is a separate cyst.  He does report tenderness at times.  Cyst can be irritated by wearing a hat.  He denies any drainage, redness, or warmth.   Review of Systems See HPI   Past Medical History:  Diagnosis Date  . Hearing loss    both ears  . History of kidney stones   . Right inguinal hernia   . Sinusitis     Social History   Socioeconomic History  . Marital status: Married    Spouse name: Not on file  . Number of children: Not on file  . Years of education: Not on file  . Highest education level: Not on file  Occupational History  . Not on file  Social Needs  . Financial resource strain: Not on file  . Food insecurity:    Worry: Not on file    Inability: Not on file  . Transportation needs:    Medical: Not on file    Non-medical: Not on file  Tobacco Use  . Smoking status: Never Smoker  . Smokeless tobacco: Never Used  Substance and Sexual Activity  . Alcohol use: Yes    Alcohol/week: 3.0 standard drinks    Types: 3 Cans of beer per week  . Drug use: No  . Sexual activity: Not on file  Lifestyle  . Physical activity:    Days per week: Not on file    Minutes per session: Not on file  . Stress: Not on file  Relationships  . Social connections:    Talks on phone: Not on file    Gets together: Not on file    Attends religious service: Not on file    Active member of club or organization: Not on file    Attends meetings of clubs or organizations: Not on file    Relationship status: Not on file  . Intimate partner violence:    Fear of current or ex partner:  Not on file    Emotionally abused: Not on file    Physically abused: Not on file    Forced sexual activity: Not on file  Other Topics Concern  . Not on file  Social History Narrative   Occupation:   Married   Never Smoked   Alcohol use- yes   Regular exercise- yes          Past Surgical History:  Procedure Laterality Date  . COLONOSCOPY  04/29/2008  . colonscopy    . INGUINAL HERNIA REPAIR Right 09/27/2016   Procedure: OPEN RIGHT INGUINAL HERNIA REPAIR WITH MESH;  Surgeon: Johnathan Hausen, MD;  Location: [facility];  Service: General;  Laterality: Right;  . INSERTION OF MESH Right 09/27/2016   Procedure: INSERTION OF MESH;  Surgeon: Johnathan Hausen, MD;  Location: [facility];  Service: General;  Laterality: Right;    Family History  Problem Relation Age of Onset  . Healthy Mother   . Healthy Father   . Healthy Brother   . Healthy Brother   . Healthy Brother   . Healthy Brother   .  Colon cancer Neg Hx   . Colon polyps Neg Hx   . Rectal cancer Neg Hx   . Stomach cancer Neg Hx   . Esophageal cancer Neg Hx     No Known Allergies  No current outpatient medications on file prior to visit.   No current facility-administered medications on file prior to visit.     BP 106/72   Temp 98 F (36.7 C)   Wt 192 lb (87.1 kg)   BMI 24.00 kg/m       Objective:   Physical Exam Vitals signs and nursing note reviewed.  Constitutional:      Appearance: Normal appearance.  Skin:    General: Skin is warm and dry.     Capillary Refill: Capillary refill takes less than 2 seconds.     Findings: No erythema.     Comments: Approximately 2 cm sebaceous cyst noted right scalp above ear.  Neurological:     General: No focal deficit present.     Mental Status: He is alert and oriented to person, place, and time.  Psychiatric:        Mood and Affect: Mood normal.        Behavior: Behavior normal.        Thought Content: Thought content normal.        Judgment: Judgment normal.        Assessment & Plan:  1. Sebaceous cyst Procedure:  Incision and drainage of sebaceous cyst Risks, benefits, and alternatives explained and consent obtained. Time out conducted. Surface cleaned with alcohol.  1 cc lidocaine with epinephine infiltrated around cyst. Adequate anesthesia ensured. Area prepped and draped in a sterile fashion. #11 blade used to make a stab incision into abscess. Brown colored sebum expressed with pressure. Curved hemostat used to explore 4 quadrants and loculations broken up. - Sac was unable to be removed - Surgi glue used for closure Hemostasis achieved. Pt stable. Aftercare and follow-up advised. - Will prescribe Keflex for contamination  - cephALEXin (KEFLEX) 500 MG capsule; Take 1 capsule (500 mg total) by mouth 3 (three) times daily for 10 days.  Dispense: 30 capsule; Refill: 0   Dorothyann Peng, NP

## 2018-09-29 ENCOUNTER — Telehealth: Payer: Self-pay | Admitting: Adult Health

## 2018-09-29 NOTE — Telephone Encounter (Signed)
Patient saw Tommi Rumps in May for a cyst on the side of his head.  Pt states Tommi Rumps said if it didn't get better to come back to see him.  Needing approval for in office visit and an appt to be seen.

## 2018-10-03 NOTE — Telephone Encounter (Signed)
Pt now scheduled to see Tommi Rumps on 10/04/2018 at 3:30 PM.  Given telephone number to call.  Nothing further needed at this time.

## 2018-10-04 ENCOUNTER — Ambulatory Visit (INDEPENDENT_AMBULATORY_CARE_PROVIDER_SITE_OTHER): Payer: Managed Care, Other (non HMO) | Admitting: Adult Health

## 2018-10-04 ENCOUNTER — Other Ambulatory Visit: Payer: Self-pay

## 2018-10-04 ENCOUNTER — Encounter: Payer: Self-pay | Admitting: Adult Health

## 2018-10-04 VITALS — BP 100/60 | Temp 97.5°F | Wt 189.0 lb

## 2018-10-04 DIAGNOSIS — L7211 Pilar cyst: Secondary | ICD-10-CM

## 2018-10-04 NOTE — Progress Notes (Signed)
Subjective:    Patient ID: Scott Schultz, male    DOB: 14-Jun-1955, 63 y.o.   MRN: [id number]  HPI 63 year old male who  has a past medical history of Hearing loss, History of kidney stones, Right inguinal hernia, and Sinusitis.  He presents to the clinic today for follow-up regarding sebaceous cyst on the right side of his scalp.  Was originally seen for this on 08/22/2018 and at this time removal of pilar cyst was trialed.  The sac was unable to be removed in its entirety.  Patient reports that he continues to have a cyst on the right side of his head.  Denies pain, redness, warmth, or drainage.   Review of Systems See HPI   Past Medical History:  Diagnosis Date  . Hearing loss    both ears  . History of kidney stones   . Right inguinal hernia   . Sinusitis     Social History   Socioeconomic History  . Marital status: Married    Spouse name: Not on file  . Number of children: Not on file  . Years of education: Not on file  . Highest education level: Not on file  Occupational History  . Not on file  Social Needs  . Financial resource strain: Not on file  . Food insecurity    Worry: Not on file    Inability: Not on file  . Transportation needs    Medical: Not on file    Non-medical: Not on file  Tobacco Use  . Smoking status: Never Smoker  . Smokeless tobacco: Never Used  Substance and Sexual Activity  . Alcohol use: Yes    Alcohol/week: 3.0 standard drinks    Types: 3 Cans of beer per week  . Drug use: No  . Sexual activity: Not on file  Lifestyle  . Physical activity    Days per week: Not on file    Minutes per session: Not on file  . Stress: Not on file  Relationships  . Social Herbalist on phone: Not on file    Gets together: Not on file    Attends religious service: Not on file    Active member of club or organization: Not on file    Attends meetings of clubs or organizations: Not on file    Relationship status: Not on file  . Intimate  partner violence    Fear of current or ex partner: Not on file    Emotionally abused: Not on file    Physically abused: Not on file    Forced sexual activity: Not on file  Other Topics Concern  . Not on file  Social History Narrative   Occupation:   Married   Never Smoked   Alcohol use- yes   Regular exercise- yes          Past Surgical History:  Procedure Laterality Date  . COLONOSCOPY  04/29/2008  . colonscopy    . INGUINAL HERNIA REPAIR Right 09/27/2016   Procedure: OPEN RIGHT INGUINAL HERNIA REPAIR WITH MESH;  Surgeon: Johnathan Hausen, MD;  Location: [facility];  Service: General;  Laterality: Right;  . INSERTION OF MESH Right 09/27/2016   Procedure: INSERTION OF MESH;  Surgeon: Johnathan Hausen, MD;  Location: [facility];  Service: General;  Laterality: Right;    Family History  Problem Relation Age of Onset  . Healthy Mother   . Healthy Father   . Healthy Brother   . Healthy  Brother   . Healthy Brother   . Healthy Brother   . Colon cancer Neg Hx   . Colon polyps Neg Hx   . Rectal cancer Neg Hx   . Stomach cancer Neg Hx   . Esophageal cancer Neg Hx     No Known Allergies  No current outpatient medications on file prior to visit.   No current facility-administered medications on file prior to visit.     BP 100/60   Temp (!) 97.5 F (36.4 C)   Wt 189 lb (85.7 kg)   BMI 23.62 kg/m       Objective:   Physical Exam Vitals signs and nursing note reviewed.  Constitutional:      Appearance: Normal appearance.  Skin:    General: Skin is warm and dry.     Comments: Small cyst noted on right side of scalp   Neurological:     General: No focal deficit present.     Mental Status: He is alert and oriented to person, place, and time.  Psychiatric:        Mood and Affect: Mood normal.        Behavior: Behavior normal.        Thought Content: Thought content normal.        Judgment: Judgment normal.       Assessment & Plan:  1. Pilar cyst -Unable to remove the  cyst during our last meeting.  Will refer to dermatology. - Ambulatory referral to Dermatology  Dorothyann Peng, NP

## 2019-11-27 ENCOUNTER — Ambulatory Visit (INDEPENDENT_AMBULATORY_CARE_PROVIDER_SITE_OTHER): Payer: Managed Care, Other (non HMO)

## 2019-11-27 ENCOUNTER — Ambulatory Visit (INDEPENDENT_AMBULATORY_CARE_PROVIDER_SITE_OTHER): Payer: Managed Care, Other (non HMO) | Admitting: Orthopaedic Surgery

## 2019-11-27 ENCOUNTER — Encounter: Payer: Self-pay | Admitting: Orthopaedic Surgery

## 2019-11-27 DIAGNOSIS — M25552 Pain in left hip: Secondary | ICD-10-CM | POA: Diagnosis not present

## 2019-11-27 DIAGNOSIS — M7062 Trochanteric bursitis, left hip: Secondary | ICD-10-CM | POA: Diagnosis not present

## 2019-11-27 NOTE — Progress Notes (Signed)
Office Visit Note   Patient: Scott Schultz           Date of Birth: Oct 06, 1955           MRN: [id number] Visit Date: 11/27/2019              Requested by: Dorothyann Peng, NP [address],  [place] [id number] PCP: Dorothyann Peng, NP   Assessment & Plan: Visit Diagnoses:  1. Pain in left hip   2. Trochanteric bursitis, left hip     Plan: Given his clinical exam findings, I do feel that this is more of a tendinitis and bursitis of the left hip area.  I recommended activity modification as well as stretching exercises and topical anti-inflammatories.  We had a long thorough discussion about this.  He may need to rest from some of his swimming strokes for a while.  If this worsens in any way my neck step would be a steroid injection combined with physical therapy.  All questions and concerns were answered and addressed.  Follow-up can be as needed.  Follow-Up Instructions: Return if symptoms worsen or fail to improve.   Orders:  Orders Placed This Encounter  Procedures   XR HIP UNILAT W OR W/O PELVIS 2-3 VIEWS LEFT   No orders of the defined types were placed in this encounter.     Procedures: No procedures performed   Clinical Data: No additional findings.   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  He swims every day.  He comes in with left hip pain has been going on for a few weeks now.  He has not had enough pain to try any medications.  Is not a sharp pain at all and does not hurt in the groin.  If he is been sitting too long he does have some pain on the left side of his hip.  It occurs more often at night after he has been in bed.  He is never had any injury to that hip.  He is a very active 64 year old with no significant medical problems or acute medical issues.  HPI  Review of Systems There is currently no listed headache, chest pain, shortness of  breath, fever, chills, nausea, vomiting  Objective: Vital Signs: There were no vitals taken for this visit.  Physical Exam He is alert and orient x3 and in no acute distress Ortho Exam Examination of his left hip shows he has full internal and external rotation with the pain on extremes of rotation on the lateral aspect of his hip but not in the groin.  When I have him lay in a supine position his leg lengths are equal.  When I have him roll onto the side there is a little bit of pain over the trochanteric area and the proximal IT band but not in the sciatic region. Specialty Comments:  No specialty comments available.  Imaging: XR HIP UNILAT W OR W/O PELVIS 2-3 VIEWS LEFT  Result Date: 11/27/2019 A standing AP pelvis and lateral left hip shows no acute findings.  The hip joint is well-maintained as is the trochanteric area with no cortical irregularities.    PMFS History: Patient Active Problem List   Diagnosis Date Noted   Urinary frequency 04/01/2014   Influenza with respiratory manifestations 03/18/2014   Routine general medical examination at a health care facility 04/16/2013  Allergic rhinitis 08/10/2012   Reactive airway disease with wheezing 08/10/2012   Contact dermatitis and other eczema due to plants (except food) 08/01/2012   Hearing loss 03/30/2012   Allergic reaction 01/28/2011   Past Medical History:  Diagnosis Date   Hearing loss    both ears   History of kidney stones    Right inguinal hernia    Sinusitis     Family History  Problem Relation Age of Onset   Healthy Mother    Healthy Father    Healthy Brother    Healthy Brother    Healthy Brother    Healthy Brother    Colon cancer Neg Hx    Colon polyps Neg Hx    Rectal cancer Neg Hx    Stomach cancer Neg Hx    Esophageal cancer Neg Hx     Past Surgical History:  Procedure Laterality Date   COLONOSCOPY  04/29/2008   colonscopy     INGUINAL HERNIA REPAIR Right 09/27/2016    Procedure: OPEN RIGHT INGUINAL HERNIA REPAIR WITH MESH;  Surgeon: Johnathan Hausen, MD;  Location: [facility];  Service: General;  Laterality: Right;   INSERTION OF MESH Right 09/27/2016   Procedure: INSERTION OF MESH;  Surgeon: Johnathan Hausen, MD;  Location: [facility];  Service: General;  Laterality: Right;   Social History   Occupational History   Not on file  Tobacco Use   Smoking status: Never Smoker   Smokeless tobacco: Never Used  Vaping Use   Vaping Use: Never used  Substance and Sexual Activity   Alcohol use: Yes    Alcohol/week: 3.0 standard drinks    Types: 3 Cans of beer per week   Drug use: No   Sexual activity: Not on file

## 2021-02-04 ENCOUNTER — Telehealth: Payer: Self-pay

## 2021-02-04 NOTE — Telephone Encounter (Signed)
NOTES SCANNED TO REFERRAL 

## 2021-02-06 DIAGNOSIS — J301 Allergic rhinitis due to pollen: Secondary | ICD-10-CM | POA: Insufficient documentation

## 2021-02-06 DIAGNOSIS — J329 Chronic sinusitis, unspecified: Secondary | ICD-10-CM | POA: Insufficient documentation

## 2021-02-06 DIAGNOSIS — E559 Vitamin D deficiency, unspecified: Secondary | ICD-10-CM | POA: Insufficient documentation

## 2021-02-06 DIAGNOSIS — Z8279 Family history of other congenital malformations, deformations and chromosomal abnormalities: Secondary | ICD-10-CM | POA: Insufficient documentation

## 2021-02-06 DIAGNOSIS — R319 Hematuria, unspecified: Secondary | ICD-10-CM | POA: Insufficient documentation

## 2021-02-06 DIAGNOSIS — Z87442 Personal history of urinary calculi: Secondary | ICD-10-CM | POA: Insufficient documentation

## 2021-02-06 DIAGNOSIS — K4090 Unilateral inguinal hernia, without obstruction or gangrene, not specified as recurrent: Secondary | ICD-10-CM | POA: Insufficient documentation

## 2021-02-06 DIAGNOSIS — E7800 Pure hypercholesterolemia, unspecified: Secondary | ICD-10-CM | POA: Insufficient documentation

## 2021-02-09 ENCOUNTER — Ambulatory Visit (INDEPENDENT_AMBULATORY_CARE_PROVIDER_SITE_OTHER): Payer: Managed Care, Other (non HMO) | Admitting: Cardiology

## 2021-02-09 ENCOUNTER — Other Ambulatory Visit: Payer: Self-pay

## 2021-02-09 ENCOUNTER — Encounter: Payer: Self-pay | Admitting: Cardiology

## 2021-02-09 VITALS — BP 118/70 | HR 58 | Ht 75.0 in | Wt 187.0 lb

## 2021-02-09 DIAGNOSIS — E782 Mixed hyperlipidemia: Secondary | ICD-10-CM

## 2021-02-09 DIAGNOSIS — Z8279 Family history of other congenital malformations, deformations and chromosomal abnormalities: Secondary | ICD-10-CM

## 2021-02-09 DIAGNOSIS — E7800 Pure hypercholesterolemia, unspecified: Secondary | ICD-10-CM

## 2021-02-09 DIAGNOSIS — R011 Cardiac murmur, unspecified: Secondary | ICD-10-CM

## 2021-02-09 DIAGNOSIS — I700 Atherosclerosis of aorta: Secondary | ICD-10-CM

## 2021-02-09 NOTE — Progress Notes (Signed)
Cardiac Cardiology Office Note:    Date:  02/09/2021   ID:  Scott Schultz, DOB Feb 27, 1956, MRN [id number]  PCP:  Dorothyann Peng, NP  Cardiologist:  Jenean Lindau, MD   Referring MD: Orvan July, NP    ASSESSMENT:    1. Family history of bicuspid aortic valve   2. Hypercholesterolemia   3. Cardiac murmur   4. Mixed dyslipidemia    PLAN:    In order of problems listed above:  Primary prevention stressed with the patient.  Importance of compliance with diet medication stressed any vocalized understanding.  Patient was advised to continue smoking and he spends very vigorously and is happy about it.  He is asymptomatic. Cardiac murmur: Echocardiogram will be done to assess murmur heard auscultation.  He is also concerned about bicuspid valve history and his present hopefully this will also reassure him and help [place] understand his cardiac anatomy. Mixed dyslipidemia: Lipids were reviewed from his MyChart records.  Diet was emphasized.  Lipid-lowering increased.  Fortunately his HDL is also significantly elevated greater than 70 and is happy about it. Coronary risk stratification: I discussed coronary calcium scan and is happy about it and willing to do it. Patient will be seen in follow-up appointment in 6 months or earlier if the patient has any concerns    Medication Adjustments/Labs and Tests Ordered: Current medicines are reviewed at length with the patient today.  Concerns regarding medicines are outlined above.  Orders Placed This Encounter  Procedures   CT CARDIAC SCORING   EKG 12-Lead   ECHOCARDIOGRAM COMPLETE    No orders of the defined types were placed in this encounter.    History of Present Illness:    Scott Schultz is a 65 y.o. male who is being seen today for the evaluation of cardiac murmur at the request of Rozanna Box, Traci A, NP.  Patient is a pleasant 65 year old male.  He tells me that he retired on Friday.  He tells me that he is brother had bicuspid  aortic valve and underwent surgery he is here for evaluation.  He denies any chest pain orthopnea or PND.  He is an active gentleman and swims on a regular basis.  At the time of my evaluation, the patient is alert awake oriented and in no distress.  He also wants restratification from cardiovascular standpoint.  Past Medical History:  Diagnosis Date   Allergic reaction 01/28/2011   Allergic rhinitis 08/10/2012   Contact dermatitis and other eczema due to plants (except food) 08/01/2012   Family history of bicuspid aortic valve    Hearing loss    Hematuria    History of kidney stones    Hypercholesterolemia    Influenza with respiratory manifestations 03/18/2014   Reactive airway disease with wheezing 08/10/2012   Right inguinal hernia    Routine general medical examination at a health care facility 04/16/2013   Seasonal allergic rhinitis due to pollen    Sinusitis    Urinary frequency 04/01/2014   Vitamin D deficiency     Past Surgical History:  Procedure Laterality Date   COLONOSCOPY  04/29/2008   colonscopy     INGUINAL HERNIA REPAIR Right 09/27/2016   Procedure: OPEN RIGHT INGUINAL HERNIA REPAIR WITH MESH;  Surgeon: Johnathan Hausen, MD;  Location: [facility];  Service: General;  Laterality: Right;   INSERTION OF MESH Right 09/27/2016   Procedure: INSERTION OF MESH;  Surgeon: Johnathan Hausen, MD;  Location: [facility];  Service: General;  Laterality:  Right;   WISDOM TOOTH EXTRACTION      Current Medications: Current Meds  Medication Sig   cromolyn (NASALCROM) 5.2 MG/ACT nasal spray Place 1 spray into both nostrils in the morning, at noon, and at bedtime.     Allergies:   Patient has no known allergies.   Social History   Socioeconomic History   Marital status: Married    Spouse name: Not on file   Number of children: Not on file   Years of education: Not on file   Highest education level: Not on file  Occupational History   Not on file  Tobacco Use   Smoking status: Never    Smokeless tobacco: Never  Vaping Use   Vaping Use: Never used  Substance and Sexual Activity   Alcohol use: Yes    Alcohol/week: 3.0 standard drinks    Types: 3 Cans of beer per week   Drug use: No   Sexual activity: Not on file  Other Topics Concern   Not on file  Social History Narrative   Occupation:   Married   Never Smoked   Alcohol use- yes   Regular exercise- yes         Social Determinants of Radio broadcast assistant Strain: Not on file  Food Insecurity: Not on file  Transportation Needs: Not on file  Physical Activity: Not on file  Stress: Not on file  Social Connections: Not on file     Family History: The patient's family history includes Healthy in his brother, brother, brother, father, and mother; Valvular heart disease in his brother. There is no history of Colon cancer, Colon polyps, Rectal cancer, Stomach cancer, or Esophageal cancer.  ROS:   Please see the history of present illness.    All other systems reviewed and are negative.  EKGs/Labs/Other Studies Reviewed:    The following studies were reviewed today: EKG reveals sinus rhythm and nonspecific ST-T changes.   Recent Labs: No results found for requested labs within last 8760 hours.  Recent Lipid Panel    Component Value Date/Time   CHOL 264 (H) 04/16/2014 1001   TRIG 27.0 04/16/2014 1001   HDL 87.40 04/16/2014 1001   CHOLHDL 3 04/16/2014 1001   VLDL 5.4 04/16/2014 1001   LDLCALC 171 (H) 04/16/2014 1001   LDLDIRECT 160.7 04/11/2013 0811    Physical Exam:    VS:  BP 118/70 (BP Location: Left Arm, Patient Position: Sitting, Cuff Size: Normal)   Pulse (!) 58   Ht 6\' 3"  (1.905 m)   Wt 187 lb (84.8 kg)   SpO2 97%   BMI 23.37 kg/m     Wt Readings from Last 3 Encounters:  02/09/21 187 lb (84.8 kg)  10/04/18 189 lb (85.7 kg)  08/22/18 192 lb (87.1 kg)     GEN: Patient is in no acute distress HEENT: Normal NECK: No JVD; No carotid bruits LYMPHATICS: No  lymphadenopathy CARDIAC: S1 S2 regular, 2/6 systolic murmur at the apex. RESPIRATORY:  Clear to auscultation without rales, wheezing or rhonchi  ABDOMEN: Soft, non-tender, non-distended MUSCULOSKELETAL:  No edema; No deformity  SKIN: Warm and dry NEUROLOGIC:  Alert and oriented x 3 PSYCHIATRIC:  Normal affect    Signed, Jenean Lindau, MD  02/09/2021 10:19 AM    [place]

## 2021-02-09 NOTE — Patient Instructions (Signed)
Medication Instructions:  No medication changes. *If you need a refill on your cardiac medications before your next appointment, please call your pharmacy*   Lab Work: None ordered If you have labs (blood work) drawn today and your tests are completely normal, you will receive your results only by: [place] (if you have MyChart) OR A paper copy in the mail If you have any lab test that is abnormal or we need to change your treatment, we will call you to review the results.   Testing/Procedures: Your physician has requested that you have an echocardiogram. Echocardiography is a painless test that uses sound waves to create images of your heart. It provides your doctor with information about the size and shape of your heart and how well your heart's chambers and valves are working. This procedure takes approximately one hour. There are no restrictions for this procedure.  We will order CT coronary calcium score. It will cost $99.00 and is not covered by insurance.  Please call [phone] to schedule.   [facility]  [address], [place] [address]    Follow-Up: At [facility], you and your health needs are our priority.  As part of our continuing mission to provide you with exceptional heart care, we have created designated Provider Care Teams.  These Care Teams include your primary Cardiologist (physician) and Advanced Practice Providers (APPs -  Physician Assistants and Nurse Practitioners) who all work together to provide you with the care you need, when you need it.  We recommend signing up for the patient portal called "MyChart".  Sign up information is provided on this After Visit Summary.  MyChart is used to connect with patients for Virtual Visits (Telemedicine).  Patients are able to view lab/test results, encounter notes, upcoming appointments, etc.  Non-urgent messages can be sent to your provider as well.   To learn more about what you can do  with MyChart, go to [email or web address].    Your next appointment:   6 month(s)  The format for your next appointment:   In Person  Provider:   Jyl Heinz, MD   Other Instructions Echocardiogram An echocardiogram is a test that uses sound waves (ultrasound) to produce images of the heart. Images from an echocardiogram can provide important information about: Heart size and shape. The size and thickness and movement of your heart's walls. Heart muscle function and strength. Heart valve function or if you have stenosis. Stenosis is when the heart valves are too narrow. If blood is flowing backward through the heart valves (regurgitation). A tumor or infectious growth around the heart valves. Areas of heart muscle that are not working well because of poor blood flow or injury from a heart attack. Aneurysm detection. An aneurysm is a weak or damaged part of an artery wall. The wall bulges out from the normal force of blood pumping through the body. Tell a health care provider about: Any allergies you have. All medicines you are taking, including vitamins, herbs, eye drops, creams, and over-the-counter medicines. Any blood disorders you have. Any surgeries you have had. Any medical conditions you have. Whether you are pregnant or may be pregnant. What are the risks? Generally, this is a safe test. However, problems may occur, including an allergic reaction to dye (contrast) that may be used during the test. What happens before the test? No specific preparation is needed. You may eat and drink normally. What happens during the test? You will take off your  clothes from the waist up and put on a hospital gown. Electrodes or electrocardiogram (ECG)patches may be placed on your chest. The electrodes or patches are then connected to a device that monitors your heart rate and rhythm. You will lie down on a table for an ultrasound exam. A gel will be applied to your chest to help  sound waves pass through your skin. A handheld device, called a transducer, will be pressed against your chest and moved over your heart. The transducer produces sound waves that travel to your heart and bounce back (or "echo" back) to the transducer. These sound waves will be captured in real-time and changed into images of your heart that can be viewed on a video monitor. The images will be recorded on a computer and reviewed by your health care provider. You may be asked to change positions or hold your breath for a short time. This makes it easier to get different views or better views of your heart. In some cases, you may receive contrast through an IV in one of your veins. This can improve the quality of the pictures from your heart. The procedure may vary among health care providers and hospitals.   What can I expect after the test? You may return to your normal, everyday life, including diet, activities, and medicines, unless your health care provider tells you not to do that. Follow these instructions at home: It is up to you to get the results of your test. Ask your health care provider, or the department that is doing the test, when your results will be ready. Keep all follow-up visits. This is important. Summary An echocardiogram is a test that uses sound waves (ultrasound) to produce images of the heart. Images from an echocardiogram can provide important information about the size and shape of your heart, heart muscle function, heart valve function, and other possible heart problems. You do not need to do anything to prepare before this test. You may eat and drink normally. After the echocardiogram is completed, you may return to your normal, everyday life, unless your health care provider tells you not to do that. This information is not intended to replace advice given to you by your health care provider. Make sure you discuss any questions you have with your health care  provider. Document Revised: 11/20/2019 Document Reviewed: 11/20/2019 Elsevier Patient Education  2021 [place].  Coronary Calcium Scan A coronary calcium scan is an imaging test used to look for deposits of plaque in the inner lining of the blood vessels of the heart (coronary arteries). Plaque is made up of calcium, protein, and fatty substances. These deposits of plaque can partly clog and narrow the coronary arteries without producing any symptoms or warning signs. This puts a person at risk for a heart attack. This test is recommended for people who are at moderate risk for heart disease. The test can find plaque deposits before symptoms develop. Tell a health care provider about: Any allergies you have. All medicines you are taking, including vitamins, herbs, eye drops, creams, and over-the-counter medicines. Any problems you or family members have had with anesthetic medicines. Any blood disorders you have. Any surgeries you have had. Any medical conditions you have. Whether you are pregnant or may be pregnant. What are the risks? Generally, this is a safe procedure. However, problems may occur, including: Harm to a pregnant woman and her unborn baby. This test involves the use of radiation. Radiation exposure can be dangerous to a  pregnant woman and her unborn baby. If you are pregnant or think you may be pregnant, you should not have this procedure done. Slight increase in the risk of cancer. This is because of the radiation involved in the test. What happens before the procedure? Ask your health care provider for any specific instructions on how to prepare for this procedure. You may be asked to avoid products that contain caffeine, tobacco, or nicotine for 4 hours before the procedure. What happens during the procedure?  You will undress and remove any jewelry from your neck or chest. You will put on a hospital gown. Sticky electrodes will be placed on your chest. The  electrodes will be connected to an electrocardiogram (ECG) machine to record a tracing of the electrical activity of your heart. You will lie down on a curved bed that is attached to the [place]. You may be given medicine to slow down your heart rate so that clear pictures can be created. You will be moved into the CT scanner, and the CT scanner will take pictures of your heart. During this time, you will be asked to lie still and hold your breath for 2-3 seconds at a time while each picture of your heart is being taken. The procedure may vary among health care providers and hospitals. What happens after the procedure? You can get dressed. You can return to your normal activities. It is up to you to get the results of your procedure. Ask your health care provider, or the department that is doing the procedure, when your results will be ready. Summary A coronary calcium scan is an imaging test used to look for deposits of plaque in the inner lining of the blood vessels of the heart (coronary arteries). Plaque is made up of calcium, protein, and fatty substances. Generally, this is a safe procedure. Tell your health care provider if you are pregnant or may be pregnant. Ask your health care provider for any specific instructions on how to prepare for this procedure. A CT scanner will take pictures of your heart. You can return to your normal activities after the scan is done. This information is not intended to replace advice given to you by your health care provider. Make sure you discuss any questions you have with your health care provider. Document Revised: 10/17/2018 Document Reviewed: 10/17/2018 Elsevier Patient Education  [place].

## 2021-02-24 ENCOUNTER — Other Ambulatory Visit: Payer: Self-pay

## 2021-02-24 ENCOUNTER — Ambulatory Visit (HOSPITAL_BASED_OUTPATIENT_CLINIC_OR_DEPARTMENT_OTHER)
Admission: RE | Admit: 2021-02-24 | Discharge: 2021-02-24 | Disposition: A | Discharge status: Home / Self Care | Payer: Managed Care, Other (non HMO) | Source: Ambulatory Visit | Attending: Cardiology | Admitting: Cardiology

## 2021-02-24 DIAGNOSIS — E7800 Pure hypercholesterolemia, unspecified: Secondary | ICD-10-CM | POA: Insufficient documentation

## 2021-02-27 ENCOUNTER — Ambulatory Visit (HOSPITAL_BASED_OUTPATIENT_CLINIC_OR_DEPARTMENT_OTHER): Payer: Managed Care, Other (non HMO)

## 2021-03-03 ENCOUNTER — Other Ambulatory Visit: Payer: Self-pay

## 2021-03-03 NOTE — Addendum Note (Signed)
Addended by: Truddie Hidden on: 03/03/2021 10:48 AM   Modules accepted: Orders

## 2021-04-10 DIAGNOSIS — H52222 Regular astigmatism, left eye: Secondary | ICD-10-CM | POA: Diagnosis not present

## 2021-05-20 DIAGNOSIS — J019 Acute sinusitis, unspecified: Secondary | ICD-10-CM | POA: Diagnosis not present

## 2021-08-06 ENCOUNTER — Ambulatory Visit: Payer: Managed Care, Other (non HMO) | Admitting: Cardiology

## 2021-10-09 DIAGNOSIS — S61512A Laceration without foreign body of left wrist, initial encounter: Secondary | ICD-10-CM | POA: Diagnosis not present

## 2021-10-09 DIAGNOSIS — L989 Disorder of the skin and subcutaneous tissue, unspecified: Secondary | ICD-10-CM | POA: Diagnosis not present

## 2021-11-30 DIAGNOSIS — H2513 Age-related nuclear cataract, bilateral: Secondary | ICD-10-CM | POA: Diagnosis not present

## 2021-11-30 DIAGNOSIS — H1045 Other chronic allergic conjunctivitis: Secondary | ICD-10-CM | POA: Diagnosis not present

## 2021-11-30 DIAGNOSIS — Z0100 Encounter for examination of eyes and vision without abnormal findings: Secondary | ICD-10-CM | POA: Diagnosis not present

## 2021-11-30 DIAGNOSIS — H5213 Myopia, bilateral: Secondary | ICD-10-CM | POA: Diagnosis not present

## 2021-11-30 DIAGNOSIS — H524 Presbyopia: Secondary | ICD-10-CM | POA: Diagnosis not present

## 2021-11-30 DIAGNOSIS — H25043 Posterior subcapsular polar age-related cataract, bilateral: Secondary | ICD-10-CM | POA: Diagnosis not present

## 2021-11-30 DIAGNOSIS — H52222 Regular astigmatism, left eye: Secondary | ICD-10-CM | POA: Diagnosis not present

## 2021-12-07 IMAGING — CT CT CARDIAC CORONARY ARTERY CALCIUM SCORE
2 series · 15 of 20 positions shown, 17 images · non-contrast
Comparison: None.
COMPARISON: None.

Addendum:
EXAM:
OVER-READ INTERPRETATION  CT CHEST

The following report is an over-read performed by radiologist Dr.
over-read does not include interpretation of cardiac or coronary
anatomy or pathology. The calcium score interpretation by the
cardiologist is attached.
CLINICAL DATA: Cardiovascular Disease Risk stratification
Coronary Calcium Score
TECHNIQUE: A gated, non-contrast computed tomography scan of the heart was
performed using 3mm slice thickness. Axial images were analyzed on a
dedicated workstation. Calcium scoring of the coronary arteries was
performed using the Agatston method.

[Series 2: cascseq 3.0 b35f 70% · axial · 0.39mm/px · z∈[-263,-155]mm · 7 of 56 slices shown]
[im 7/56  vessel]
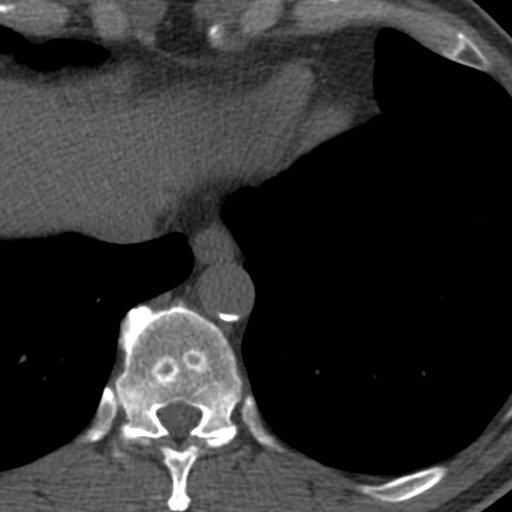
[im 13/56  vessel]
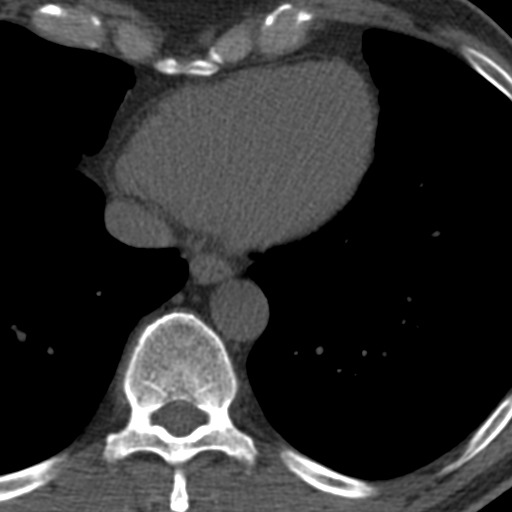
[im 19/56  vessel]
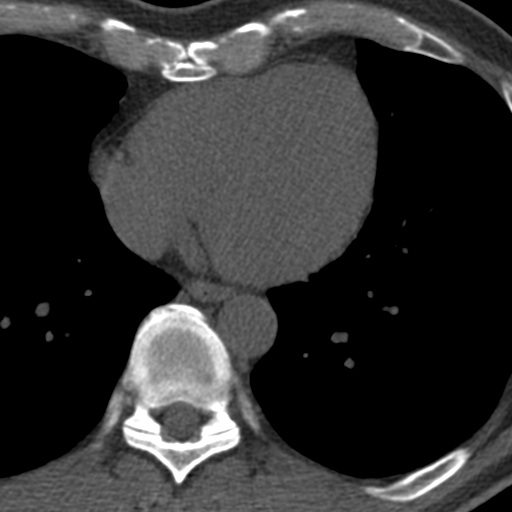
[im 25/56  vessel]
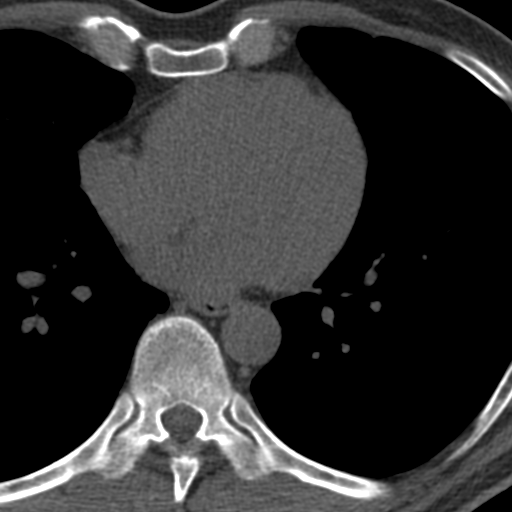
[im 31/56  vessel]
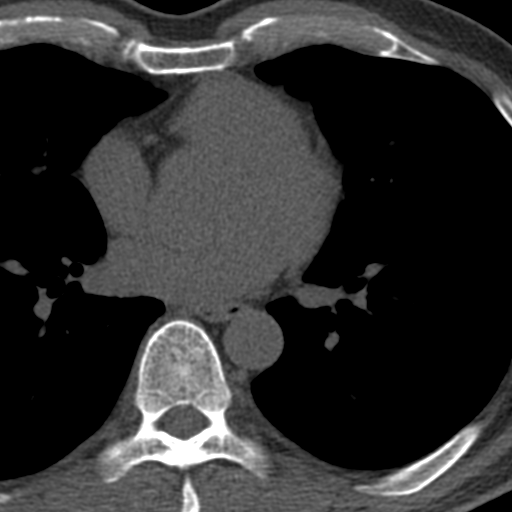
[im 37/56  vessel]
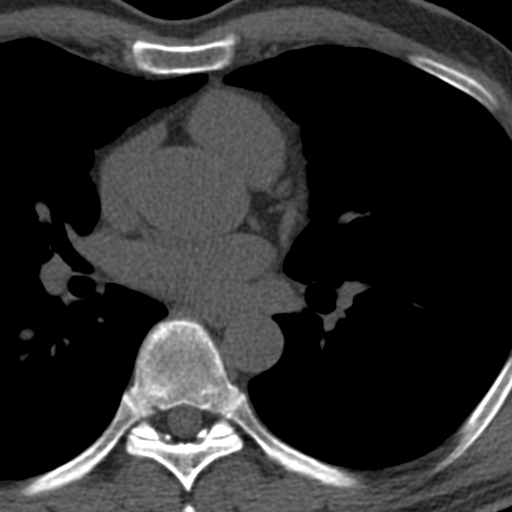
[im 43/56  vessel]
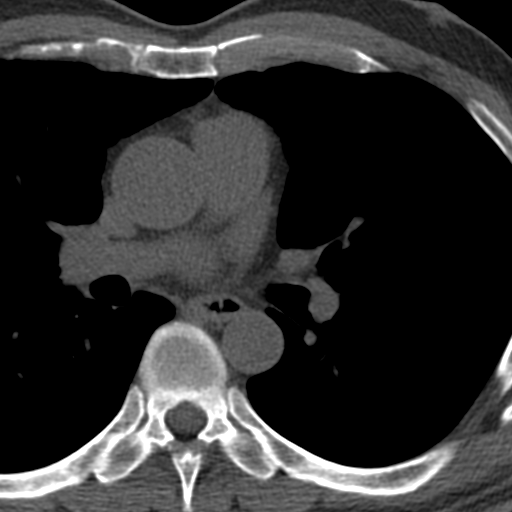

[Series 3: full fov st · axial · 0.76mm/px · z∈[-263,-137]mm · 8 of 56 slices shown, 10 images]
[im 7/56  vessel]
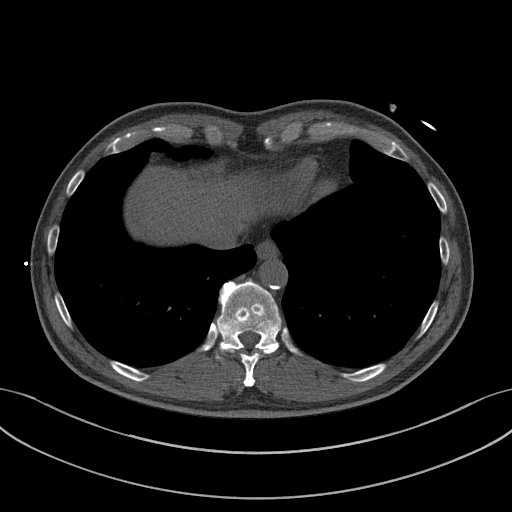
[im 7/56  lung]
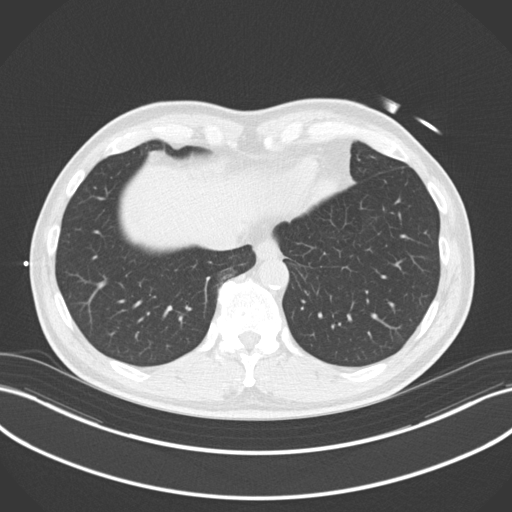
[im 13/56  vessel]
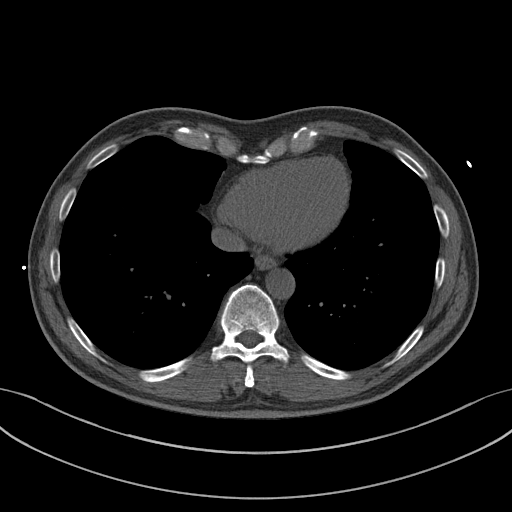
[im 19/56  vessel]
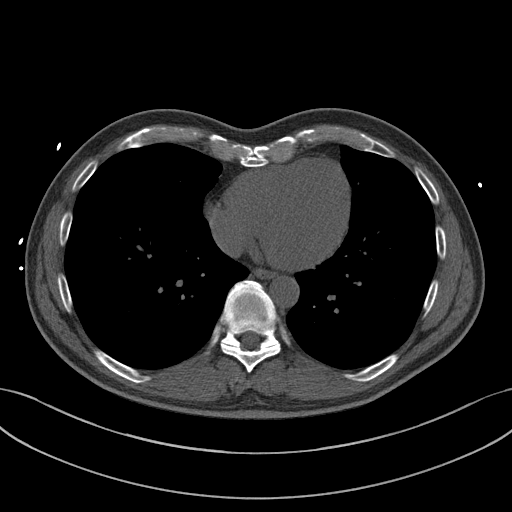
[im 25/56  vessel]
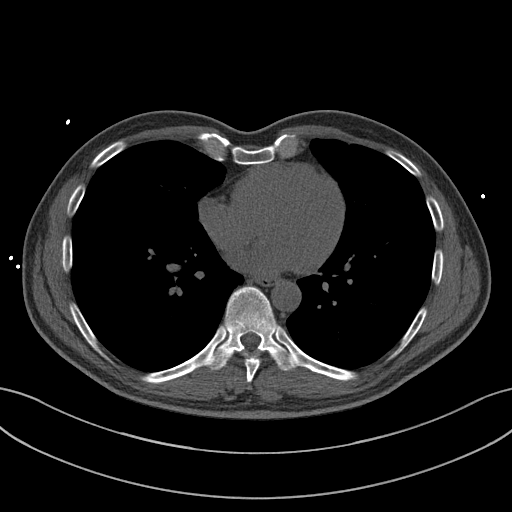
[im 31/56  vessel]
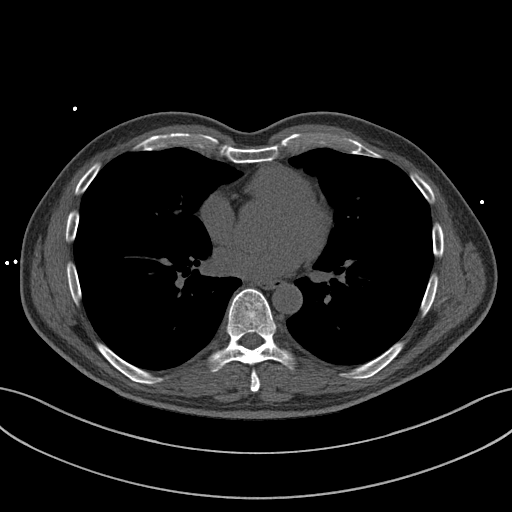
[im 31/56  lung]
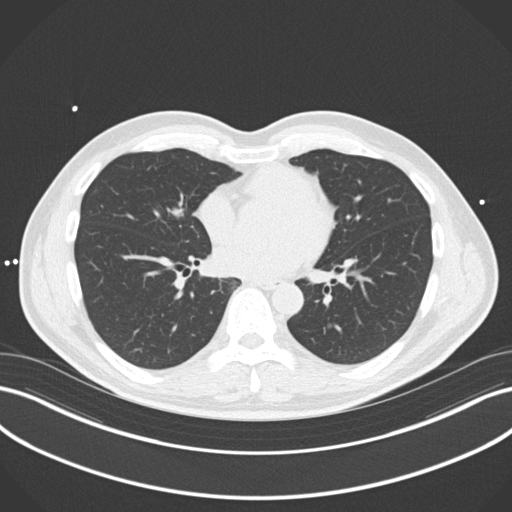
[im 37/56  vessel]
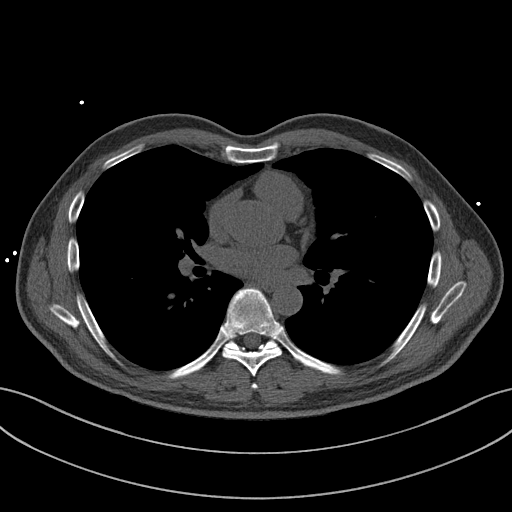
[im 43/56  vessel]
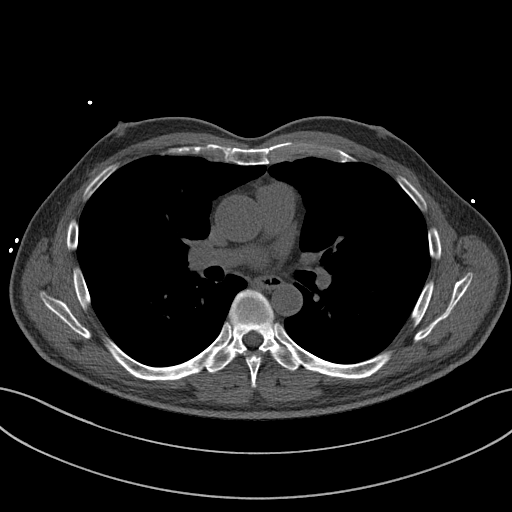
[im 49/56  vessel]
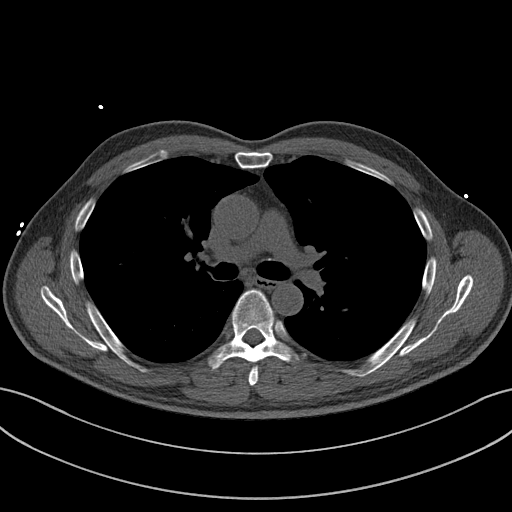

[15 of 20 positions shown; findings below may reference images not displayed]

FINDINGS: Vascular: Aortic atherosclerosis.

Mediastinum/Nodes: No imaged thoracic adenopathy.

Lungs/Pleura: No pleural fluid.  Clear imaged lungs.

Upper Abdomen: Normal imaged portions of the liver, stomach.

Musculoskeletal: No acute osseous abnormality.
IMPRESSION: 1.  No acute findings in the imaged extracardiac chest.
2.  Aortic Atherosclerosis (ERKSY-BSD.D).
FINDINGS: Coronary Calcium Score:

Left main: 0

Left anterior descending artery: 0

Left circumflex artery: 0

Right coronary artery: 0

Total: 0

Percentile: NA

Pericardium: Normal.

Non-cardiac: See separate report from [REDACTED].
IMPRESSION: Coronary calcium score of 0.



If CAC=0, it is reasonable to withhold statin therapy and reassess
in 5 to 10 years, as long as higher risk conditions are absent
(diabetes mellitus, family history of premature CHD in first degree
relatives (males <55 years; females <65 years), cigarette smoking,
or LDL >=190 mg/dL).

If CAC is 1 to 99, it is reasonable to initiate statin therapy for
patients >=55 years of age.

If CAC is >=100 or >=75th percentile, it is reasonable to initiate
statin therapy at any age.

Cardiology referral should be considered for patients with CAC
scores >=400 or >=75th percentile.

*8791 AHA/ACC/AACVPR/AAPA/ABC/DARTORA/HUGO ANDRES/YIFEI/[place] Can/CHIO/WALLYN/SIRAH
Guideline on the Management of Blood Cholesterol: A Report of the
American College of Cardiology/American Heart Association Task Force
on Clinical Practice Guidelines. J Am Coll Cardiol.
1586;73(24):9402-9252.

*** End of Addendum ***
EXAM:
OVER-READ INTERPRETATION  CT CHEST

The following report is an over-read performed by radiologist Dr.
over-read does not include interpretation of cardiac or coronary
anatomy or pathology. The calcium score interpretation by the
cardiologist is attached.
FINDINGS: Vascular: Aortic atherosclerosis.

Mediastinum/Nodes: No imaged thoracic adenopathy.

Lungs/Pleura: No pleural fluid.  Clear imaged lungs.

Upper Abdomen: Normal imaged portions of the liver, stomach.

Musculoskeletal: No acute osseous abnormality.
IMPRESSION: 1.  No acute findings in the imaged extracardiac chest.
2.  Aortic Atherosclerosis (ERKSY-BSD.D).

## 2021-12-18 DIAGNOSIS — E7800 Pure hypercholesterolemia, unspecified: Secondary | ICD-10-CM | POA: Diagnosis not present

## 2021-12-18 DIAGNOSIS — Z6823 Body mass index (BMI) 23.0-23.9, adult: Secondary | ICD-10-CM | POA: Diagnosis not present

## 2021-12-18 DIAGNOSIS — E559 Vitamin D deficiency, unspecified: Secondary | ICD-10-CM | POA: Diagnosis not present

## 2021-12-18 DIAGNOSIS — Z0000 Encounter for general adult medical examination without abnormal findings: Secondary | ICD-10-CM | POA: Diagnosis not present

## 2021-12-18 DIAGNOSIS — Z125 Encounter for screening for malignant neoplasm of prostate: Secondary | ICD-10-CM | POA: Diagnosis not present

## 2021-12-18 DIAGNOSIS — Z23 Encounter for immunization: Secondary | ICD-10-CM | POA: Diagnosis not present

## 2022-02-04 ENCOUNTER — Other Ambulatory Visit: Payer: Self-pay | Admitting: Cardiology

## 2022-02-04 DIAGNOSIS — R011 Cardiac murmur, unspecified: Secondary | ICD-10-CM

## 2022-02-04 DIAGNOSIS — Z8279 Family history of other congenital malformations, deformations and chromosomal abnormalities: Secondary | ICD-10-CM

## 2022-04-13 DIAGNOSIS — J329 Chronic sinusitis, unspecified: Secondary | ICD-10-CM | POA: Diagnosis not present

## 2022-04-13 DIAGNOSIS — R051 Acute cough: Secondary | ICD-10-CM | POA: Diagnosis not present

## 2022-04-27 DIAGNOSIS — R051 Acute cough: Secondary | ICD-10-CM | POA: Diagnosis not present

## 2022-04-27 DIAGNOSIS — J069 Acute upper respiratory infection, unspecified: Secondary | ICD-10-CM | POA: Diagnosis not present

## 2022-07-05 DIAGNOSIS — Z6822 Body mass index (BMI) 22.0-22.9, adult: Secondary | ICD-10-CM | POA: Diagnosis not present

## 2022-07-05 DIAGNOSIS — J0101 Acute recurrent maxillary sinusitis: Secondary | ICD-10-CM | POA: Diagnosis not present

## 2022-07-05 DIAGNOSIS — R051 Acute cough: Secondary | ICD-10-CM | POA: Diagnosis not present

## 2022-08-11 DIAGNOSIS — J069 Acute upper respiratory infection, unspecified: Secondary | ICD-10-CM | POA: Diagnosis not present

## 2022-12-28 DIAGNOSIS — E7800 Pure hypercholesterolemia, unspecified: Secondary | ICD-10-CM | POA: Diagnosis not present

## 2022-12-28 DIAGNOSIS — Z0000 Encounter for general adult medical examination without abnormal findings: Secondary | ICD-10-CM | POA: Diagnosis not present

## 2022-12-28 DIAGNOSIS — Z125 Encounter for screening for malignant neoplasm of prostate: Secondary | ICD-10-CM | POA: Diagnosis not present

## 2022-12-28 DIAGNOSIS — Z6821 Body mass index (BMI) 21.0-21.9, adult: Secondary | ICD-10-CM | POA: Diagnosis not present

## 2023-04-04 DIAGNOSIS — R051 Acute cough: Secondary | ICD-10-CM | POA: Diagnosis not present

## 2023-04-04 DIAGNOSIS — Z6823 Body mass index (BMI) 23.0-23.9, adult: Secondary | ICD-10-CM | POA: Diagnosis not present

## 2023-04-04 DIAGNOSIS — J019 Acute sinusitis, unspecified: Secondary | ICD-10-CM | POA: Diagnosis not present

## 2023-05-17 DIAGNOSIS — H25043 Posterior subcapsular polar age-related cataract, bilateral: Secondary | ICD-10-CM | POA: Diagnosis not present

## 2023-05-17 DIAGNOSIS — H2513 Age-related nuclear cataract, bilateral: Secondary | ICD-10-CM | POA: Diagnosis not present

## 2023-05-23 DIAGNOSIS — D485 Neoplasm of uncertain behavior of skin: Secondary | ICD-10-CM | POA: Diagnosis not present

## 2023-05-23 DIAGNOSIS — L7211 Pilar cyst: Secondary | ICD-10-CM | POA: Diagnosis not present

## 2023-06-09 ENCOUNTER — Encounter: Payer: Self-pay | Admitting: Gastroenterology

## 2023-06-13 DIAGNOSIS — J329 Chronic sinusitis, unspecified: Secondary | ICD-10-CM | POA: Diagnosis not present

## 2023-06-13 DIAGNOSIS — Z6823 Body mass index (BMI) 23.0-23.9, adult: Secondary | ICD-10-CM | POA: Diagnosis not present

## 2023-06-16 DIAGNOSIS — J329 Chronic sinusitis, unspecified: Secondary | ICD-10-CM | POA: Diagnosis not present

## 2023-06-24 DIAGNOSIS — R5383 Other fatigue: Secondary | ICD-10-CM | POA: Diagnosis not present

## 2023-06-24 DIAGNOSIS — J329 Chronic sinusitis, unspecified: Secondary | ICD-10-CM | POA: Diagnosis not present

## 2023-06-24 DIAGNOSIS — J0101 Acute recurrent maxillary sinusitis: Secondary | ICD-10-CM | POA: Diagnosis not present

## 2023-06-24 DIAGNOSIS — Z6823 Body mass index (BMI) 23.0-23.9, adult: Secondary | ICD-10-CM | POA: Diagnosis not present

## 2023-06-30 ENCOUNTER — Ambulatory Visit (AMBULATORY_SURGERY_CENTER): Payer: Self-pay

## 2023-06-30 VITALS — Ht 75.0 in | Wt 180.0 lb

## 2023-06-30 DIAGNOSIS — Z860100 Personal history of colon polyps, unspecified: Secondary | ICD-10-CM

## 2023-06-30 MED ORDER — SUFLAVE 178.7 G PO SOLR: 1.0000 | Freq: Once | ORAL | 0 refills | Status: DC

## 2023-06-30 NOTE — Progress Notes (Signed)

## 2023-07-01 ENCOUNTER — Telehealth: Payer: Self-pay | Admitting: Gastroenterology

## 2023-07-01 ENCOUNTER — Other Ambulatory Visit: Payer: Self-pay

## 2023-07-01 DIAGNOSIS — Z860100 Personal history of colon polyps, unspecified: Secondary | ICD-10-CM

## 2023-07-01 MED ORDER — SUFLAVE 178.7 G PO SOLR: 1.0000 | Freq: Once | ORAL | 0 refills | Status: AC

## 2023-07-01 MED ORDER — PEG 3350-KCL-NA BICARB-NACL 420 G PO SOLR: 4000.0000 mL | Freq: Once | ORAL | 0 refills | Status: AC

## 2023-07-01 NOTE — Telephone Encounter (Signed)
 Spoke with patient. Rx for golytely sent to local [facility]. New prep instructions sent to mychart hard copy will be mailed to home address. Suflave also sent to [facility] as pt is wanting to compare prep cost.

## 2023-07-01 NOTE — Telephone Encounter (Signed)
 Patient called and stated that his insurance is charging him 125 dollars to receive his prep medication at the pharmacy, patient is now wanting to know if we can send his prep medication to [facility]. Patient is requesting a call back. Please advise.

## 2023-07-21 DIAGNOSIS — Z0100 Encounter for examination of eyes and vision without abnormal findings: Secondary | ICD-10-CM | POA: Diagnosis not present

## 2023-07-24 ENCOUNTER — Encounter: Payer: Self-pay | Admitting: Certified Registered Nurse Anesthetist

## 2023-07-26 ENCOUNTER — Encounter: Payer: Self-pay | Admitting: Gastroenterology

## 2023-07-28 ENCOUNTER — Ambulatory Visit: Payer: Self-pay | Admitting: Gastroenterology

## 2023-07-28 ENCOUNTER — Encounter: Payer: Self-pay | Admitting: Gastroenterology

## 2023-07-28 VITALS — BP 122/70 | HR 16 | Resp 16

## 2023-07-28 DIAGNOSIS — K5730 Diverticulosis of large intestine without perforation or abscess without bleeding: Secondary | ICD-10-CM

## 2023-07-28 DIAGNOSIS — D123 Benign neoplasm of transverse colon: Secondary | ICD-10-CM | POA: Diagnosis not present

## 2023-07-28 DIAGNOSIS — Z1211 Encounter for screening for malignant neoplasm of colon: Secondary | ICD-10-CM

## 2023-07-28 DIAGNOSIS — L723 Sebaceous cyst: Secondary | ICD-10-CM | POA: Diagnosis not present

## 2023-07-28 DIAGNOSIS — K621 Rectal polyp: Secondary | ICD-10-CM | POA: Diagnosis not present

## 2023-07-28 DIAGNOSIS — Z860101 Personal history of adenomatous and serrated colon polyps: Secondary | ICD-10-CM | POA: Diagnosis not present

## 2023-07-28 DIAGNOSIS — K648 Other hemorrhoids: Secondary | ICD-10-CM

## 2023-07-28 DIAGNOSIS — D128 Benign neoplasm of rectum: Secondary | ICD-10-CM

## 2023-07-28 DIAGNOSIS — Z860100 Personal history of colon polyps, unspecified: Secondary | ICD-10-CM

## 2023-07-28 DIAGNOSIS — K6289 Other specified diseases of anus and rectum: Secondary | ICD-10-CM | POA: Diagnosis not present

## 2023-07-28 DIAGNOSIS — Z6823 Body mass index (BMI) 23.0-23.9, adult: Secondary | ICD-10-CM | POA: Diagnosis not present

## 2023-07-28 DIAGNOSIS — K120 Recurrent oral aphthae: Secondary | ICD-10-CM | POA: Diagnosis not present

## 2023-07-28 DIAGNOSIS — E7800 Pure hypercholesterolemia, unspecified: Secondary | ICD-10-CM | POA: Diagnosis not present

## 2023-07-28 MED ORDER — SODIUM CHLORIDE 0.9 % IV SOLN: 500.0000 mL | INTRAVENOUS | Status: DC

## 2023-07-28 NOTE — Progress Notes (Signed)
 History and Physical:  This patient presents for endoscopic testing for: Encounter Diagnosis  Name Primary?   History of colonic polyps Yes    68 year old man here today for surveillance colonoscopy with a history of colon polyps.  3 diminutive tubular adenomas last colonoscopy March 2020.  No polyps in 2010. Patient denies chronic abdominal pain, rectal bleeding, constipation or diarrhea.   Patient is otherwise without complaints or active issues today.   Past Medical History: Past Medical History:  Diagnosis Date   Allergic reaction 01/28/2011   Allergic rhinitis 08/10/2012   Contact dermatitis and other eczema due to plants (except food) 08/01/2012   Family history of bicuspid aortic valve    Hearing loss    Hematuria    History of kidney stones    Hypercholesterolemia    Hyperlipemia    Influenza with respiratory manifestations 03/18/2014   Reactive airway disease with wheezing 08/10/2012   Right inguinal hernia    Seasonal allergic rhinitis due to pollen    Sinusitis    Urinary frequency 04/01/2014   Vitamin D deficiency      Past Surgical History: Past Surgical History:  Procedure Laterality Date   COLONOSCOPY  04/29/2008   colonscopy     INGUINAL HERNIA REPAIR Right 09/27/2016   Procedure: OPEN RIGHT INGUINAL HERNIA REPAIR WITH MESH;  Surgeon: Jacolyn Matar, MD;  Location: [facility];  Service: General;  Laterality: Right;   INSERTION OF MESH Right 09/27/2016   Procedure: INSERTION OF MESH;  Surgeon: Jacolyn Matar, MD;  Location: [facility];  Service: General;  Laterality: Right;   WISDOM TOOTH EXTRACTION      Allergies: No Known Allergies  Outpatient Meds: Current Outpatient Medications  Medication Sig Dispense Refill   cromolyn (NASALCROM) 5.2 MG/ACT nasal spray Place 1 spray into both nostrils in the morning, at noon, and at bedtime.     simvastatin (ZOCOR) 20 MG tablet Take 20 mg by mouth daily in the afternoon.     Current Facility-Administered  Medications  Medication Dose Route Frequency Provider Last Rate Last Admin   0.9 %  sodium chloride infusion  500 mL Intravenous Continuous Danis, Roel Clarity III, MD          ___________________________________________________________________ Objective   Exam:  There were no vitals taken for this visit.  CV: regular , S1/S2 Resp: clear to auscultation bilaterally, normal RR and effort noted GI: soft, no tenderness, with active bowel sounds.   Assessment: Encounter Diagnosis  Name Primary?   History of colonic polyps Yes     Plan: Colonoscopy   The benefits and risks of the planned procedure(s) were described in detail with the patient or (when appropriate) their health care proxy.  Risks were outlined as including, but not limited to, bleeding, infection, perforation, adverse medication reaction leading to cardiac or pulmonary decompensation, pancreatitis (if ERCP).  The limitation of incomplete mucosal visualization was also discussed.  No guarantees or warranties were given.  The patient is appropriate for an endoscopic procedure in the ambulatory setting.   - [name], MD

## 2023-07-28 NOTE — Patient Instructions (Addendum)
Resume previous diet Continue present medications Await pathology results  Handouts/information given for polyps, diverticulosis   YOU HAD AN ENDOSCOPIC PROCEDURE TODAY AT THE [facility]:   Refer to the procedure report that was given to you for any specific questions about what was found during the examination.  If the procedure report does not answer your questions, please call your gastroenterologist to clarify.  If you requested that your care partner not be given the details of your procedure findings, then the procedure report has been included in a sealed envelope for you to review at your convenience later.  YOU SHOULD EXPECT: Some feelings of bloating in the abdomen. Passage of more gas than usual.  Walking can help get rid of the air that was put into your GI tract during the procedure and reduce the bloating. If you had a lower endoscopy (such as a colonoscopy or flexible sigmoidoscopy) you may notice spotting of blood in your stool or on the toilet paper. If you underwent a bowel prep for your procedure, you may not have a normal bowel movement for a few days.  Please Note:  You might notice some irritation and congestion in your nose or some drainage.  This is from the oxygen used during your procedure.  There is no need for concern and it should clear up in a day or so.  SYMPTOMS TO REPORT IMMEDIATELY:  Following lower endoscopy (colonoscopy or flexible sigmoidoscopy):  Excessive amounts of blood in the stool  Significant tenderness or worsening of abdominal pains  Swelling of the abdomen that is new, acute  Fever of 100F or higher  For urgent or emergent issues, a gastroenterologist can be reached at any hour by calling [phone]. Do not use MyChart messaging for urgent concerns.    DIET:  We do recommend a small meal at first, but then you may proceed to your regular diet.  Drink plenty of fluids but you should avoid alcoholic beverages for 24  hours.  ACTIVITY:  You should plan to take it easy for the rest of today and you should NOT DRIVE or use heavy machinery until tomorrow (because of the sedation medicines used during the test).    FOLLOW UP: Our staff will call the number listed on your records the next business day following your procedure.  We will call around 7:15- 8:00 am to check on you and address any questions or concerns that you may have regarding the information given to you following your procedure. If we do not reach you, we will leave a message.     If any biopsies were taken you will be contacted by phone or by letter within the next 1-3 weeks.  Please call us at [phone] if you have not heard about the biopsies in 3 weeks.    SIGNATURES/CONFIDENTIALITY: You and/or your care partner have signed paperwork which will be entered into your electronic medical record.  These signatures attest to the fact that that the information above on your After Visit Summary has been reviewed and is understood.  Full responsibility of the confidentiality of this discharge information lies with you and/or your care-partner. 

## 2023-07-28 NOTE — Progress Notes (Signed)
 Report given to PACU, vss

## 2023-07-28 NOTE — Op Note (Signed)
 [facility] Patient Name: Scott Schultz Procedure Date: 07/28/2023 8:28 AM MRN: [id number] Endoscopist: Sherilyn Cooter L. Myrtie Neither , MD, [id number] Age: 68 Referring MD:  Date of Birth: [birth year] Gender: Male Account #: [id number] Procedure:                Colonoscopy Indications:              Surveillance: Personal history of adenomatous                            polyps on last colonoscopy 5 years ago                           3 diminutive tubular adenomas March 2020 no polyps                            in 2010 Medicines:                Monitored Anesthesia Care Procedure:                Pre-Anesthesia Assessment:                           - Prior to the procedure, a History and Physical                            was performed, and patient medications and                            allergies were reviewed. The patient's tolerance of                            previous anesthesia was also reviewed. The risks                            and benefits of the procedure and the sedation                            options and risks were discussed with the patient.                            All questions were answered, and informed consent                            was obtained. Prior Anticoagulants: The patient has                            taken no anticoagulant or antiplatelet agents. ASA                            Grade Assessment: II - A patient with mild systemic                            disease. After reviewing the risks and benefits,  the patient was deemed in satisfactory condition to                            undergo the procedure.                           After obtaining informed consent, the colonoscope                            was passed under direct vision. Throughout the                            procedure, the patient's blood pressure, pulse, and                            oxygen saturations were monitored continuously. The                             CF [id number] #[id number] was introduced through the anus                            and advanced to the the cecum, identified by                            appendiceal orifice and ileocecal valve. The                            colonoscopy was performed without difficulty. The                            patient tolerated the procedure well. The quality                            of the bowel preparation was good except the cecum                            and proximal ascending colon were fair before                            lavage, with opaque adherent material. Extensive                            lavage resulted in good visualization in those                            areas and throughout the colon. The ileocecal                            valve, appendiceal orifice, and rectum were                            photographed. The bowel preparation used was SUPREP. Scope In: 8:36:52 AM Scope Out: 9:01:27 AM Scope Withdrawal Time: 0 hours 19 minutes 30 seconds  Total Procedure  Duration: 0 hours 24 minutes 35 seconds  Findings:                 The perianal and digital rectal examinations were                            normal.                           Repeat examination of right colon under NBI                            performed.                           A diminutive polyp was found in the proximal                            transverse colon. The polyp was sessile. The polyp                            was removed with a cold snare. Resection and                            retrieval were complete. (Jar 1)                           A diminutive polyp was found in the rectum. The                            polyp was sessile. The polyp was removed with a                            cold snare. Resection and retrieval were complete.                            (Jar 2)                           A few small-mouthed diverticula were found in the                            left colon.                            Internal hemorrhoids and hypertrophied anal papilla                            were found.                           Retroflexion in the rectum was not performed due to                            anatomy.  The exam was otherwise without abnormality. Complications:            No immediate complications. Estimated Blood Loss:     Estimated blood loss was minimal. Impression:               - One diminutive polyp in the proximal transverse                            colon, removed with a cold snare. Resected and                            retrieved.                           - One diminutive polyp in the rectum, removed with                            a cold snare. Resected and retrieved.                           - Diverticulosis in the left colon.                           - Internal hemorrhoids and hypertrophied anal                            papilla.                           - The examination was otherwise normal. Recommendation:           - Patient has a contact number available for                            emergencies. The signs and symptoms of potential                            delayed complications were discussed with the                            patient. Return to normal activities tomorrow.                            Written discharge instructions were provided to the                            patient.                           - Resume previous diet.                           - Continue present medications.                           - Await pathology results.                           -  Repeat colonoscopy is recommended for                            surveillance. The colonoscopy date will be                            determined after pathology results from today's                            exam become available for review. Nulytely prep for                            next colonoscopy (see prep details above) Scott Kaster L.  Scott Friendly, MD 07/28/2023 9:09:16 AM This report has been signed electronically.

## 2023-07-28 NOTE — Progress Notes (Signed)
 Called to room to assist during endoscopic procedure.  Patient ID and intended procedure confirmed with present staff. Received instructions for my participation in the procedure from the performing physician.

## 2023-07-28 NOTE — Progress Notes (Signed)
 Pt's states no medical or surgical changes since previsit or office visit.

## 2023-08-01 ENCOUNTER — Telehealth: Payer: Self-pay

## 2023-08-01 NOTE — Telephone Encounter (Signed)
  Follow up Call-     07/28/2023    8:02 AM  Call back number  Post procedure Call Back phone  # [phone]  Permission to leave phone message Yes     Patient questions:  Do you have a fever, pain , or abdominal swelling? No. Pain Score  0 *  Have you tolerated food without any problems? Yes.    Have you been able to return to your normal activities? Yes.    Do you have any questions about your discharge instructions: Diet   No. Medications  No. Follow up visit  No.  Do you have questions or concerns about your Care? No.  Actions: * If pain score is 4 or above: No action needed, pain <4.

## 2023-08-02 LAB — SURGICAL PATHOLOGY

## 2023-08-03 ENCOUNTER — Encounter: Payer: Self-pay | Admitting: Gastroenterology

## 2023-08-22 DIAGNOSIS — Z6823 Body mass index (BMI) 23.0-23.9, adult: Secondary | ICD-10-CM | POA: Diagnosis not present

## 2023-08-22 DIAGNOSIS — J069 Acute upper respiratory infection, unspecified: Secondary | ICD-10-CM | POA: Diagnosis not present

## 2023-09-20 DIAGNOSIS — Z6823 Body mass index (BMI) 23.0-23.9, adult: Secondary | ICD-10-CM | POA: Diagnosis not present

## 2023-09-20 DIAGNOSIS — Z23 Encounter for immunization: Secondary | ICD-10-CM | POA: Diagnosis not present

## 2023-09-20 DIAGNOSIS — R760 Raised antibody titer: Secondary | ICD-10-CM | POA: Diagnosis not present

## 2023-12-22 DIAGNOSIS — J019 Acute sinusitis, unspecified: Secondary | ICD-10-CM | POA: Diagnosis not present

## 2023-12-28 DIAGNOSIS — Z0100 Encounter for examination of eyes and vision without abnormal findings: Secondary | ICD-10-CM | POA: Diagnosis not present

## 2024-01-03 DIAGNOSIS — Z0000 Encounter for general adult medical examination without abnormal findings: Secondary | ICD-10-CM | POA: Diagnosis not present

## 2024-01-03 DIAGNOSIS — E559 Vitamin D deficiency, unspecified: Secondary | ICD-10-CM | POA: Diagnosis not present

## 2024-01-03 DIAGNOSIS — E7800 Pure hypercholesterolemia, unspecified: Secondary | ICD-10-CM | POA: Diagnosis not present

## 2024-01-03 DIAGNOSIS — Z23 Encounter for immunization: Secondary | ICD-10-CM | POA: Diagnosis not present

## 2024-01-03 DIAGNOSIS — Z125 Encounter for screening for malignant neoplasm of prostate: Secondary | ICD-10-CM | POA: Diagnosis not present
